# Patient Record
Sex: Female | Born: 1990 | Race: Black or African American | Hispanic: No | Marital: Single | Smoking: Current every day smoker
Health system: Southern US, Community
[De-identification: ages and names within clinical notes are randomized; demographics above are authoritative.]

## PROBLEM LIST (undated history)

## (undated) ENCOUNTER — Inpatient Hospital Stay (HOSPITAL_COMMUNITY): Payer: Self-pay

## (undated) DIAGNOSIS — O139 Gestational [pregnancy-induced] hypertension without significant proteinuria, unspecified trimester: Secondary | ICD-10-CM

## (undated) DIAGNOSIS — A749 Chlamydial infection, unspecified: Secondary | ICD-10-CM

## (undated) DIAGNOSIS — IMO0002 Reserved for concepts with insufficient information to code with codable children: Secondary | ICD-10-CM

## (undated) DIAGNOSIS — N760 Acute vaginitis: Secondary | ICD-10-CM

## (undated) DIAGNOSIS — B379 Candidiasis, unspecified: Secondary | ICD-10-CM

## (undated) DIAGNOSIS — N750 Cyst of Bartholin's gland: Secondary | ICD-10-CM

## (undated) DIAGNOSIS — Z8619 Personal history of other infectious and parasitic diseases: Secondary | ICD-10-CM

## (undated) DIAGNOSIS — A549 Gonococcal infection, unspecified: Secondary | ICD-10-CM

## (undated) DIAGNOSIS — B9689 Other specified bacterial agents as the cause of diseases classified elsewhere: Secondary | ICD-10-CM

## (undated) HISTORY — DX: Candidiasis, unspecified: B37.9

## (undated) HISTORY — DX: Reserved for concepts with insufficient information to code with codable children: IMO0002

## (undated) HISTORY — PX: WISDOM TOOTH EXTRACTION: SHX21

## (undated) HISTORY — DX: Personal history of other infectious and parasitic diseases: Z86.19

---
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
## 2008-10-27 ENCOUNTER — Emergency Department (HOSPITAL_COMMUNITY): Admission: EM | Admit: 2008-10-27 | Discharge: 2008-10-27 | Payer: Self-pay | Admitting: Family Medicine

## 2008-12-06 ENCOUNTER — Emergency Department (HOSPITAL_COMMUNITY): Admission: EM | Admit: 2008-12-06 | Discharge: 2008-12-06 | Payer: Self-pay | Admitting: Emergency Medicine

## 2009-11-09 ENCOUNTER — Emergency Department (HOSPITAL_COMMUNITY): Admission: EM | Admit: 2009-11-09 | Discharge: 2009-11-09 | Payer: Self-pay | Admitting: Family Medicine

## 2009-11-24 ENCOUNTER — Emergency Department (HOSPITAL_COMMUNITY): Admission: EM | Admit: 2009-11-24 | Discharge: 2009-11-24 | Payer: Self-pay | Admitting: Family Medicine

## 2010-05-21 ENCOUNTER — Emergency Department (HOSPITAL_COMMUNITY)
Admission: EM | Admit: 2010-05-21 | Discharge: 2010-05-21 | Payer: Self-pay | Source: Home / Self Care | Admitting: Emergency Medicine

## 2010-07-05 ENCOUNTER — Inpatient Hospital Stay (INDEPENDENT_AMBULATORY_CARE_PROVIDER_SITE_OTHER)
Admission: RE | Admit: 2010-07-05 | Discharge: 2010-07-05 | Disposition: A | Discharge status: Home / Self Care | Payer: BC Managed Care – PPO | Source: Ambulatory Visit | Attending: Family Medicine | Admitting: Family Medicine

## 2010-07-05 DIAGNOSIS — I889 Nonspecific lymphadenitis, unspecified: Secondary | ICD-10-CM

## 2010-08-13 LAB — GC/CHLAMYDIA PROBE AMP, GENITAL: GC Probe Amp, Genital: POSITIVE — AB

## 2010-08-13 LAB — POCT PREGNANCY, URINE: Preg Test, Ur: NEGATIVE

## 2010-08-13 LAB — POCT URINALYSIS DIP (DEVICE)
Hgb urine dipstick: NEGATIVE
Protein, ur: NEGATIVE mg/dL
Specific Gravity, Urine: 1.025 (ref 1.005–1.030)
Urobilinogen, UA: 0.2 mg/dL (ref 0.0–1.0)

## 2010-08-13 LAB — WET PREP, GENITAL: WBC, Wet Prep HPF POC: NONE SEEN

## 2010-09-03 LAB — HCG, SERUM, QUALITATIVE: Preg, Serum: NEGATIVE

## 2011-03-10 ENCOUNTER — Inpatient Hospital Stay (INDEPENDENT_AMBULATORY_CARE_PROVIDER_SITE_OTHER)
Admission: RE | Admit: 2011-03-10 | Discharge: 2011-03-10 | Disposition: A | Discharge status: Home / Self Care | Payer: BC Managed Care – PPO | Source: Ambulatory Visit | Attending: Emergency Medicine | Admitting: Emergency Medicine

## 2011-03-10 DIAGNOSIS — N898 Other specified noninflammatory disorders of vagina: Secondary | ICD-10-CM

## 2011-03-10 LAB — POCT URINALYSIS DIP (DEVICE)
Glucose, UA: NEGATIVE mg/dL
Leukocytes, UA: NEGATIVE
Nitrite: NEGATIVE
Urobilinogen, UA: 1 mg/dL (ref 0.0–1.0)

## 2011-03-10 LAB — WET PREP, GENITAL
Trich, Wet Prep: NONE SEEN
Yeast Wet Prep HPF POC: NONE SEEN

## 2011-03-10 LAB — POCT I-STAT, CHEM 8
HCT: 43 % (ref 36.0–46.0)
Hemoglobin: 14.6 g/dL (ref 12.0–15.0)
Potassium: 4.1 mEq/L (ref 3.5–5.1)
Sodium: 141 mEq/L (ref 135–145)

## 2011-05-09 ENCOUNTER — Emergency Department (INDEPENDENT_AMBULATORY_CARE_PROVIDER_SITE_OTHER)
Admission: EM | Admit: 2011-05-09 | Discharge: 2011-05-09 | Disposition: A | Discharge status: Home / Self Care | Payer: BC Managed Care – PPO | Source: Home / Self Care | Attending: Emergency Medicine | Admitting: Emergency Medicine

## 2011-05-09 DIAGNOSIS — Z3400 Encounter for supervision of normal first pregnancy, unspecified trimester: Secondary | ICD-10-CM

## 2011-05-09 DIAGNOSIS — Z331 Pregnant state, incidental: Secondary | ICD-10-CM

## 2011-05-09 MED ORDER — PRENATAL VITAMINS 0.8 MG PO TABS: 1.0000 | ORAL_TABLET | Freq: Every day | ORAL | Status: DC

## 2011-05-09 NOTE — ED Provider Notes (Signed)
History     CSN: [id number] Arrival date & time: 05/09/2011  7:26 PM   First MD Initiated Contact with Patient 05/09/11 1908      Chief Complaint  Patient presents with  . Possible Pregnancy    (Consider location/radiation/quality/duration/timing/severity/associated sxs/prior treatment) HPI Comments: She comes in today for a pregnancy test. Her last menstrual period was November 11. She has had no morning sickness, but has had some breast swelling and tenderness. She's had mild lower abdominal crampy pain. Has not had any bleeding. She has had unprotected intercourse. She denies any upper abdominal pain, vomiting, or fever. This is her first pregnancy. She's otherwise in good health and is not taking any medications.   History reviewed. No pertinent past medical history.  History reviewed. No pertinent past surgical history.  No family history on file.  History  Substance Use Topics  . Smoking status: Current Everyday Smoker  . Smokeless tobacco: Not on file  . Alcohol Use: No    OB History    Grav Para Term Preterm Abortions TAB SAB Ect Mult Living                  Review of Systems  Constitutional: Negative for fever and chills.  Gastrointestinal: Negative for nausea, vomiting, abdominal pain and diarrhea.  Genitourinary: Positive for menstrual problem. Negative for dysuria, urgency, frequency, hematuria, vaginal bleeding, vaginal discharge, difficulty urinating, genital sores, vaginal pain, pelvic pain and dyspareunia.    Allergies  Review of patient's allergies indicates no known allergies.  Home Medications   Current Outpatient Rx  Name Route Sig Dispense Refill  . PRENATAL VITAMINS 0.8 MG PO TABS Oral Take 1 tablet by mouth daily. 30 tablet 0    BP 126/72  Pulse 93  Temp(Src) 98.4 F (36.9 C) (Oral)  Resp 16  SpO2 100%  LMP 04/08/2011  Physical Exam  Nursing note and vitals reviewed. Constitutional: She is oriented to person, place, and time. She  appears well-developed and well-nourished. No distress.  HENT:  Head: Normocephalic and atraumatic.  Mouth/Throat: Oropharynx is clear and moist.  Eyes: Conjunctivae and EOM are normal. Pupils are equal, round, and reactive to light. No scleral icterus.  Neck: Normal range of motion. Neck supple.  Cardiovascular: Normal rate, regular rhythm and normal heart sounds.  Exam reveals no gallop and no friction rub.   No murmur heard. Pulmonary/Chest: Effort normal and breath sounds normal. No respiratory distress. She has no wheezes. She has no rales.  Abdominal: Soft. Bowel sounds are normal. She exhibits no distension and no mass. There is no tenderness. There is no rebound and no guarding.  Lymphadenopathy:    She has no cervical adenopathy.  Neurological: She is alert and oriented to person, place, and time.  Skin: Skin is warm and dry. No rash noted. She is not diaphoretic.    ED Course  Procedures (including critical care time)   Labs Reviewed  POCT PREGNANCY, URINE  POCT PREGNANCY, URINE   No results found.   1. Pregnancy, first       MDM  She is about [redacted] weeks pregnant. Right now she would it to seek a pregnancy termination. I gave her the name of the [facility] clinic and [address] to followup with or women's hospital clinics if she should decide to keep the baby.        Roque Lias, MD 05/09/11 2158

## 2011-05-09 NOTE — ED Notes (Signed)
Desires pregnancy test, LMP 04-08-11.  States one negative and one positive at home UPT.  Denies sx.

## 2011-05-12 ENCOUNTER — Inpatient Hospital Stay (HOSPITAL_COMMUNITY)
Admission: AD | Admit: 2011-05-12 | Discharge: 2011-05-12 | Disposition: A | Discharge status: Home / Self Care | Payer: BC Managed Care – PPO | Source: Ambulatory Visit | Attending: Obstetrics and Gynecology | Admitting: Obstetrics and Gynecology

## 2011-05-12 ENCOUNTER — Encounter (HOSPITAL_COMMUNITY): Payer: Self-pay | Admitting: *Deleted

## 2011-05-12 ENCOUNTER — Inpatient Hospital Stay (HOSPITAL_COMMUNITY): Payer: BC Managed Care – PPO

## 2011-05-12 DIAGNOSIS — R109 Unspecified abdominal pain: Secondary | ICD-10-CM | POA: Insufficient documentation

## 2011-05-12 DIAGNOSIS — Z1389 Encounter for screening for other disorder: Secondary | ICD-10-CM

## 2011-05-12 DIAGNOSIS — Z3490 Encounter for supervision of normal pregnancy, unspecified, unspecified trimester: Secondary | ICD-10-CM

## 2011-05-12 DIAGNOSIS — O2390 Unspecified genitourinary tract infection in pregnancy, unspecified trimester: Secondary | ICD-10-CM | POA: Insufficient documentation

## 2011-05-12 DIAGNOSIS — N760 Acute vaginitis: Secondary | ICD-10-CM | POA: Insufficient documentation

## 2011-05-12 DIAGNOSIS — A499 Bacterial infection, unspecified: Secondary | ICD-10-CM

## 2011-05-12 DIAGNOSIS — B9689 Other specified bacterial agents as the cause of diseases classified elsewhere: Secondary | ICD-10-CM

## 2011-05-12 LAB — ABO/RH: ABO/RH(D): B POS

## 2011-05-12 LAB — CBC
Hemoglobin: 13.8 g/dL (ref 12.0–15.0)
MCH: 28.3 pg (ref 26.0–34.0)
MCV: 84.4 fL (ref 78.0–100.0)
RBC: 4.87 MIL/uL (ref 3.87–5.11)

## 2011-05-12 LAB — WET PREP, GENITAL
Trich, Wet Prep: NONE SEEN
Yeast Wet Prep HPF POC: NONE SEEN

## 2011-05-12 LAB — OB RESULTS CONSOLE HIV ANTIBODY (ROUTINE TESTING): HIV: NONREACTIVE

## 2011-05-12 MED ORDER — METRONIDAZOLE 0.75 % VA GEL: 1.0000 | Freq: Two times a day (BID) | VAGINAL | Status: AC

## 2011-05-12 MED ORDER — METRONIDAZOLE 500 MG PO TABS: 500.0000 mg | ORAL_TABLET | Freq: Two times a day (BID) | ORAL | Status: DC

## 2011-05-12 NOTE — Progress Notes (Signed)
Pt reports having cramping on and off all month. Went to urgent care and told to come to [facility] if cramping continues. Denies vag discahrge or bleeding.

## 2011-05-12 NOTE — ED Provider Notes (Signed)
History     No chief complaint on file.  HPI 20 y.o. G1P0 at [redacted]w[redacted]d with low abd cramping, no vaginal bleeding or discharge.    Past Medical History  Diagnosis Date  . No pertinent past medical history     Past Surgical History  Procedure Date  . No past surgeries     History reviewed. No pertinent family history.  History  Substance Use Topics  . Smoking status: Current Everyday Smoker -- 0.2 packs/day  . Smokeless tobacco: Not on file  . Alcohol Use: No    Allergies: No Known Allergies  No prescriptions prior to admission    Review of Systems  Constitutional: Negative.   Respiratory: Negative.   Cardiovascular: Negative.   Gastrointestinal: Positive for abdominal pain. Negative for nausea, vomiting, diarrhea and constipation.  Genitourinary: Negative for dysuria, urgency, frequency, hematuria and flank pain.       Negative for vaginal bleeding, vaginal discharge, dyspareunia  Musculoskeletal: Negative.   Neurological: Negative.   Psychiatric/Behavioral: Negative.    Physical Exam   Blood pressure 107/74, pulse 89, temperature 98.5 F (36.9 C), temperature source Oral, resp. rate 18, height 5' 6.5" (1.689 m), weight 186 lb 12.8 oz (84.732 kg), last menstrual period 04/08/2011.  Physical Exam  Constitutional: She is oriented to person, place, and time. She appears well-developed and well-nourished. No distress.  HENT:  Head: Normocephalic and atraumatic.  Cardiovascular: Normal rate, regular rhythm and normal heart sounds.   Respiratory: Effort normal and breath sounds normal. No respiratory distress.  GI: Soft. Bowel sounds are normal. She exhibits no distension and no mass. There is no tenderness. There is no rebound and no guarding.  Genitourinary: There is no rash or lesion on the right labia. There is no rash or lesion on the left labia. Uterus is not deviated, not enlarged, not fixed and not tender. Cervix exhibits no motion tenderness, no discharge and no  friability. Right adnexum displays no mass, no tenderness and no fullness. Left adnexum displays tenderness. Left adnexum displays no mass and no fullness. No erythema, tenderness or bleeding around the vagina. No vaginal discharge found.  Neurological: She is alert and oriented to person, place, and time.  Skin: Skin is warm and dry.  Psychiatric: She has a normal mood and affect.    MAU Course  Procedures  Results for orders placed during the hospital encounter of 05/12/11 (from the past 24 hour(s))  CBC     Status: Normal   Collection Time   05/12/11  1:45 PM      Component Value Range   WBC 7.2  4.0 - 10.5 (K/uL)   RBC 4.87  3.87 - 5.11 (MIL/uL)   Hemoglobin 13.8  12.0 - 15.0 (g/dL)   HCT 11.9  14.7 - 82.9 (%)   MCV 84.4  78.0 - 100.0 (fL)   MCH 28.3  26.0 - 34.0 (pg)   MCHC 33.6  30.0 - 36.0 (g/dL)   RDW 56.2  13.0 - 86.5 (%)   Platelets 321  150 - 400 (K/uL)  ABO/RH     Status: Normal   Collection Time   05/12/11  1:45 PM      Component Value Range   ABO/RH(D) B POS    HCG, QUANTITATIVE, PREGNANCY     Status: Abnormal   Collection Time   05/12/11  1:45 PM      Component Value Range   hCG, Beta Chain, Quant, S 4984 (*) <5 (mIU/mL)  WET PREP, GENITAL  Status: Abnormal   Collection Time   05/12/11  1:55 PM      Component Value Range   Yeast, Wet Prep NONE SEEN  NONE SEEN    Trich, Wet Prep NONE SEEN  NONE SEEN    Clue Cells, Wet Prep MANY (*) NONE SEEN    WBC, Wet Prep HPF POC FEW (*) NONE SEEN     U/S: 5.1 wk IUGS with yolk sac, no fetal pole seen yet Assessment and Plan  20 y.o. G1P0 at [redacted]w[redacted]d BV - rx flagyl Pt unsure if she will continue the pregnancy or not - gave pregnancy verification letter if needed [facility] 05/12/2011, 3:30 PM

## 2011-05-13 NOTE — ED Provider Notes (Signed)
Agree with above note.  Kelly Chan 05/13/2011 7:42 AM

## 2011-05-14 LAB — GC/CHLAMYDIA PROBE AMP, GENITAL
Chlamydia, DNA Probe: NEGATIVE
GC Probe Amp, Genital: NEGATIVE

## 2011-05-19 ENCOUNTER — Encounter (HOSPITAL_COMMUNITY): Payer: Self-pay | Admitting: *Deleted

## 2011-05-19 ENCOUNTER — Inpatient Hospital Stay (HOSPITAL_COMMUNITY): Payer: BC Managed Care – PPO

## 2011-05-19 ENCOUNTER — Inpatient Hospital Stay (HOSPITAL_COMMUNITY)
Admission: AD | Admit: 2011-05-19 | Discharge: 2011-05-19 | Disposition: A | Discharge status: Home / Self Care | Payer: BC Managed Care – PPO | Source: Ambulatory Visit | Attending: Obstetrics and Gynecology | Admitting: Obstetrics and Gynecology

## 2011-05-19 DIAGNOSIS — O200 Threatened abortion: Secondary | ICD-10-CM | POA: Insufficient documentation

## 2011-05-19 HISTORY — DX: Gonococcal infection, unspecified: A54.9

## 2011-05-19 NOTE — Progress Notes (Signed)
Saw pink blood when wiping after voiding.  Was in MAU last week, dx with BV, started gel for treatment yesterday.

## 2011-05-19 NOTE — Progress Notes (Signed)
Pt reprots having some vaginal bleeding when wiped today. . Reports mild cramping. Reports intercourse 2 days ago.

## 2011-05-19 NOTE — ED Provider Notes (Signed)
History     CSN: [id number]  Arrival date & time 05/19/11  1152   None     Chief Complaint  Patient presents with  . Vaginal Bleeding    (Consider location/radiation/quality/duration/timing/severity/associated sxs/prior treatment) HPI Kelly Chan is a 20 y.o. female @ [redacted]w[redacted]d gestation who presents to MAU for vaginal bleeding. She was evaluated here on 05/12/11 and had an IUP at 5 weeks and one day with a YS. Blood type B positive, cultures for GC and Chlamydia were negative.  She also has had lower abdominal cramping. LMP 04/09/11. Last pap smear = never had one. Current sex partner x 2 years. Hx of GC one year ago. No birth control.The history was provided by the patient.  Past Medical History  Diagnosis Date  . Gonorrhea     Past Surgical History  Procedure Date  . No past surgeries     History reviewed. No pertinent family history.  History  Substance Use Topics  . Smoking status: Current Everyday Smoker -- 0.2 packs/day  . Smokeless tobacco: Never Used  . Alcohol Use: No    OB History    Grav Para Term Preterm Abortions TAB SAB Ect Mult Living   1               Review of Systems  Constitutional: Negative for fever, chills, diaphoresis and fatigue.  HENT: Negative for ear pain, congestion, sore throat, facial swelling, neck pain, neck stiffness, dental problem and sinus pressure.   Eyes: Negative for photophobia, pain and discharge.  Respiratory: Negative for cough, chest tightness and wheezing.   Cardiovascular: Negative.   Gastrointestinal: Positive for abdominal pain and constipation. Negative for nausea, vomiting, diarrhea and abdominal distention.  Genitourinary: Positive for vaginal bleeding and pelvic pain. Negative for dysuria, frequency, flank pain and difficulty urinating.  Musculoskeletal: Negative for myalgias, back pain and gait problem.  Skin: Negative for color change and rash.  Neurological: Positive for headaches. Negative for dizziness,  speech difficulty, weakness, light-headedness and numbness.  Psychiatric/Behavioral: Negative for confusion and agitation. The patient is not nervous/anxious.     Allergies  Review of patient's allergies indicates no known allergies.  Home Medications  No current outpatient prescriptions on file.  BP 109/75  Pulse 84  Temp(Src) 98.6 F (37 C) (Oral)  Resp 84  Ht 5' 5.5" (1.664 m)  Wt 187 lb 6.4 oz (85.004 kg)  BMI 30.71 kg/m2  LMP 04/08/2011  Physical Exam  Nursing note and vitals reviewed. Constitutional: She is oriented to person, place, and time. She appears well-developed and well-nourished.  HENT:  Head: Normocephalic.  Eyes: EOM are normal.  Neck: Neck supple.  Cardiovascular: Normal rate.   Pulmonary/Chest: Effort normal.  Abdominal: Soft. There is no tenderness.  Genitourinary:       External genitalia without lesions. Scant blood vaginal vault. Cervix long, closed, no CMT, no adnexal tenderness. Uterus slightly enlarged.  Musculoskeletal: Normal range of motion.  Neurological: She is alert and oriented to person, place, and time. No cranial nerve deficit.  Skin: Skin is warm and dry.  Psychiatric: She has a normal mood and affect. Her behavior is normal. Judgment and thought content normal.    US Ob Transvaginal  05/19/2011  *RADIOLOGY REPORT*  Clinical Data: Spotting.  Assess viability.  TRANSVAGINAL OB ULTRASOUND  Technique:  Transvaginal ultrasound was performed for evaluation of the gestation as well as the maternal uterus and adnexal regions.  Comparison: 05/12/2011.  Findings: Single intrauterine gestation.  By crown-rump length,  estimated gestational age is 6 weeks 2 days.  Fetal heart rate 137 beats per minute.  No subchorionic hemorrhage.  Right ovary unremarkable.  Again noted is a large cyst within the left ovary measuring up to 5.5 cm.  Single internal septation.  IMPRESSION: Single intrauterine gestation, 6-week-2-day.  Fetal heart rate 137 beats per  minute.  Stable large left ovarian cyst.  Original Report Authenticated By: Cyndie Chime, M.D.   Assessment: Viable IUP at 6 weeks and 2 days gestation   threatened AB  Plan: Start prenatal care  Instructions to patient re: threatened AB ED Course  Procedures  MDM          Kerrie Buffalo, NP 05/19/11 1505

## 2011-05-20 NOTE — ED Provider Notes (Signed)
Agree with above note.  Kelly Chan 05/20/2011 7:52 AM   

## 2011-05-27 ENCOUNTER — Inpatient Hospital Stay (HOSPITAL_COMMUNITY)
Admission: AD | Admit: 2011-05-27 | Discharge: 2011-05-27 | Disposition: A | Discharge status: Home / Self Care | Payer: BC Managed Care – PPO | Source: Ambulatory Visit | Attending: Obstetrics & Gynecology | Admitting: Obstetrics & Gynecology

## 2011-05-27 ENCOUNTER — Encounter (HOSPITAL_COMMUNITY): Payer: Self-pay | Admitting: Obstetrics and Gynecology

## 2011-05-27 DIAGNOSIS — O26899 Other specified pregnancy related conditions, unspecified trimester: Secondary | ICD-10-CM

## 2011-05-27 DIAGNOSIS — O219 Vomiting of pregnancy, unspecified: Secondary | ICD-10-CM

## 2011-05-27 DIAGNOSIS — O210 Mild hyperemesis gravidarum: Secondary | ICD-10-CM | POA: Insufficient documentation

## 2011-05-27 DIAGNOSIS — O99891 Other specified diseases and conditions complicating pregnancy: Secondary | ICD-10-CM | POA: Insufficient documentation

## 2011-05-27 DIAGNOSIS — R109 Unspecified abdominal pain: Secondary | ICD-10-CM | POA: Insufficient documentation

## 2011-05-27 LAB — URINALYSIS, ROUTINE W REFLEX MICROSCOPIC
Bilirubin Urine: NEGATIVE
Glucose, UA: NEGATIVE mg/dL
Hgb urine dipstick: NEGATIVE
Ketones, ur: NEGATIVE mg/dL
Specific Gravity, Urine: 1.02 (ref 1.005–1.030)
pH: 6 (ref 5.0–8.0)

## 2011-05-27 MED ORDER — ONDANSETRON HCL 8 MG PO TABS: 8.0000 mg | ORAL_TABLET | Freq: Three times a day (TID) | ORAL | Status: AC | PRN

## 2011-05-27 MED ORDER — ONDANSETRON 8 MG PO TBDP
8.0000 mg | ORAL_TABLET | Freq: Once | ORAL | Status: AC
  Administered 2011-05-27: 8 mg via ORAL
  Filled 2011-05-27: qty 1

## 2011-05-27 NOTE — Progress Notes (Signed)
"  My lower RT side has been hurting for the last 2 days.  The pain is always constant and then there will be a sharp intense pain that comes and goes.  I've just started getting nauseated, so I'm throwing up everything I eat. No longer spotting after finishing the medication for my bacterial infection."

## 2011-05-27 NOTE — Progress Notes (Signed)
Pt states, " I've had pain in my lower right side of my abdomen for 2 days; sometimes the pain goes lower."

## 2011-05-27 NOTE — Progress Notes (Signed)
Pt reports decrease in nausea>desires RX for Zofran

## 2011-05-27 NOTE — ED Provider Notes (Signed)
History     Chief Complaint  Patient presents with  . Abdominal Pain   HPI My lower RT side has been hurting for the last 2 days. The pain is always constant and then there will be a sharp intense pain that comes and goes. I've just started getting nauseated, so I'm throwing up everything I eat. No longer spotting after finishing the medication for my bacterial infection."  Ultrasound on 12/22 showed a single intrauterine gestation, 6-week-2-day. Fetal heart rate 137 beats per minute;  In addition there was a stable large left ovarian cyst.     Past Medical History  Diagnosis Date  . Gonorrhea   . No pertinent past medical history     Past Surgical History  Procedure Date  . No past surgeries     Family History  Problem Relation Age of Onset  . Cancer Maternal Aunt   . Cancer Maternal Aunt     History  Substance Use Topics  . Smoking status: Former Smoker -- 0.2 packs/day    Quit date: 05/25/2011  . Smokeless tobacco: Never Used   Comment: "I've quit because I can't tolerate the smell anymore."  . Alcohol Use: No    Allergies: No Known Allergies  Prescriptions prior to admission  Medication Sig Dispense Refill  . Prenatal Vit-Fe Fumarate-FA (PRENATAL MULTIVITAMIN) TABS Take 1 tablet by mouth daily.          Review of Systems  Gastrointestinal: Positive for nausea, vomiting and abdominal pain.  Genitourinary: Negative.        No vaginal bleeding   Physical Exam   Blood pressure 116/76, pulse 93, temperature 99.1 F (37.3 C), temperature source Oral, resp. rate 18, height 5' 5.5" (1.664 m), weight 81.874 kg (180 lb 8 oz), last menstrual period 04/08/2011, SpO2 98.00%.  Physical Exam  Constitutional: She is oriented to person, place, and time. She appears well-developed and well-nourished. No distress.       Laughing with guest  HENT:  Head: Normocephalic.  Mouth/Throat: Mucous membranes are normal. Mucous membranes are not dry.  Neck: Normal range of motion.  Neck supple.  Cardiovascular: Normal rate, regular rhythm and normal heart sounds.   Respiratory: Effort normal and breath sounds normal.  GI: Soft. She exhibits no mass. There is no tenderness. There is no rebound and no guarding.       +fetal heart tones with bedside ultrasound  Genitourinary: No bleeding around the vagina.  Neurological: She is alert and oriented to person, place, and time.  Skin: Skin is warm and dry.    MAU Course  Procedures  Results for orders placed during the hospital encounter of 05/27/11 (from the past 24 hour(s))  URINALYSIS, ROUTINE W REFLEX MICROSCOPIC     Status: Normal   Collection Time   05/27/11  2:42 AM      Component Value Range   Color, Urine YELLOW  YELLOW    APPearance CLEAR  CLEAR    Specific Gravity, Urine 1.020  1.005 - 1.030    pH 6.0  5.0 - 8.0    Glucose, UA NEGATIVE  NEGATIVE (mg/dL)   Hgb urine dipstick NEGATIVE  NEGATIVE    Bilirubin Urine NEGATIVE  NEGATIVE    Ketones, ur NEGATIVE  NEGATIVE (mg/dL)   Protein, ur NEGATIVE  NEGATIVE (mg/dL)   Urobilinogen, UA 0.2  0.0 - 1.0 (mg/dL)   Nitrite NEGATIVE  NEGATIVE    Leukocytes, UA NEGATIVE  NEGATIVE      Assessment and Plan  Vomiting in Pregnancy Abdominal Pain in Early Pregnancy  Plan: DC to home RX Zofran Pregnancy Precautions  [facility] 05/27/2011, 2:26 AM

## 2011-06-29 DIAGNOSIS — IMO0002 Reserved for concepts with insufficient information to code with codable children: Secondary | ICD-10-CM

## 2011-06-29 DIAGNOSIS — R87619 Unspecified abnormal cytological findings in specimens from cervix uteri: Secondary | ICD-10-CM

## 2011-06-29 HISTORY — DX: Reserved for concepts with insufficient information to code with codable children: IMO0002

## 2011-06-29 HISTORY — DX: Unspecified abnormal cytological findings in specimens from cervix uteri: R87.619

## 2011-07-26 ENCOUNTER — Inpatient Hospital Stay (HOSPITAL_COMMUNITY)
Admission: AD | Admit: 2011-07-26 | Discharge: 2011-07-26 | Disposition: A | Discharge status: Home Health | Payer: BC Managed Care – PPO | Source: Ambulatory Visit | Attending: Obstetrics & Gynecology | Admitting: Obstetrics & Gynecology

## 2011-07-26 ENCOUNTER — Encounter (HOSPITAL_COMMUNITY): Payer: Self-pay | Admitting: *Deleted

## 2011-07-26 DIAGNOSIS — O99891 Other specified diseases and conditions complicating pregnancy: Secondary | ICD-10-CM | POA: Insufficient documentation

## 2011-07-26 DIAGNOSIS — R32 Unspecified urinary incontinence: Secondary | ICD-10-CM | POA: Insufficient documentation

## 2011-07-26 DIAGNOSIS — R350 Frequency of micturition: Secondary | ICD-10-CM | POA: Insufficient documentation

## 2011-07-26 HISTORY — DX: Acute vaginitis: N76.0

## 2011-07-26 HISTORY — DX: Acute vaginitis: B96.89

## 2011-07-26 LAB — URINALYSIS, ROUTINE W REFLEX MICROSCOPIC
Bilirubin Urine: NEGATIVE
Hgb urine dipstick: NEGATIVE
Specific Gravity, Urine: 1.015 (ref 1.005–1.030)
pH: 7.5 (ref 5.0–8.0)

## 2011-07-26 LAB — WET PREP, GENITAL
Clue Cells Wet Prep HPF POC: NONE SEEN
Trich, Wet Prep: NONE SEEN
Yeast Wet Prep HPF POC: NONE SEEN

## 2011-07-26 NOTE — ED Provider Notes (Signed)
History     Chief Complaint  Patient presents with  . Urinary Frequency   HPI Kelly Chan is 21 y.o. G1P0 [redacted]w[redacted]d weeks presenting with urinary frequency, voiding small amounts of urine and  incontinence. Began with this pregnancy-last 3 weeks.  Denies vaginal bleeding or vaginal discharge.  Denies fever or chills.  Pt of Dr. Huel Coventry but was told they will not accept Medicaid for this pregnancy.  Plans to look for another doctor.      Past Medical History  Diagnosis Date  . Gonorrhea   . No pertinent past medical history   . BV (bacterial vaginosis)     Past Surgical History  Procedure Date  . Wisdom tooth extraction     Family History  Problem Relation Age of Onset  . Cancer Maternal Aunt   . Cancer Maternal Aunt     History  Substance Use Topics  . Smoking status: Former Smoker -- 0.2 packs/day    Quit date: 05/25/2011  . Smokeless tobacco: Never Used   Comment: "I've quit because I can't tolerate the smell anymore."  . Alcohol Use: No    Allergies: No Known Allergies  Prescriptions prior to admission  Medication Sig Dispense Refill  . Multiple Vitamins-Minerals (MULTIVITAMIN GUMMIES ADULT PO) Take 2 tablets by mouth every morning.      . promethazine (PHENERGAN) 25 MG tablet Take 25 mg by mouth at bedtime as needed. For nausea      . Pseudoephed-APAP-Guaifenesin (TYLENOL SINUS SEVERE CONGEST PO) Take 2 tablets by mouth daily as needed. For headache        Review of Systems  Genitourinary: Positive for frequency.       + for incontinence with laughing, sneezing Neg for vaginal bleeding and vaginal discharge   Physical Exam   Blood pressure 111/72, pulse 117, temperature 97.4 F (36.3 C), temperature source Oral, resp. rate 16, height 5\' 5"  (1.651 m), weight 85.095 kg (187 lb 9.6 oz), last menstrual period 04/08/2011, SpO2 100.00%.  Physical Exam  Constitutional: She is oriented to person, place, and time. She appears well-developed and  well-nourished.  HENT:  Head: Normocephalic.  Neck: Normal range of motion.  Respiratory: Effort normal.  GI: Soft.       Gravid  Genitourinary: Uterus is enlarged. Cervix exhibits no discharge and no friability. No bleeding around the vagina. Vaginal discharge (small amount of white discharge without odor) found.       Bladder and uterus appear to be well supported.  Neurological: She is alert and oriented to person, place, and time.  Skin: Skin is warm and dry.   Results for orders placed during the hospital encounter of 07/26/11 (from the past 24 hour(s))  URINALYSIS, ROUTINE W REFLEX MICROSCOPIC     Status: Abnormal   Collection Time   07/26/11  1:25 PM      Component Value Range   Color, Urine YELLOW  YELLOW    APPearance CLOUDY (*) CLEAR    Specific Gravity, Urine 1.015  1.005 - 1.030    pH 7.5  5.0 - 8.0    Glucose, UA NEGATIVE  NEGATIVE (mg/dL)   Hgb urine dipstick NEGATIVE  NEGATIVE    Bilirubin Urine NEGATIVE  NEGATIVE    Ketones, ur NEGATIVE  NEGATIVE (mg/dL)   Protein, ur NEGATIVE  NEGATIVE (mg/dL)   Urobilinogen, UA 0.2  0.0 - 1.0 (mg/dL)   Nitrite NEGATIVE  NEGATIVE    Leukocytes, UA NEGATIVE  NEGATIVE   WET PREP, GENITAL  Status: Abnormal   Collection Time   07/26/11  2:35 PM      Component Value Range   Yeast Wet Prep HPF POC NONE SEEN  NONE SEEN    Trich, Wet Prep NONE SEEN  NONE SEEN    Clue Cells Wet Prep HPF POC NONE SEEN  NONE SEEN    WBC, Wet Prep HPF POC MANY (*) NONE SEEN    MAU Course  Procedures   GC/CHl recently done at Dr. Huel Coventry office--not repeated today.  MDM  Reviewed lab results with the patient and reassured there were no signs of infection.  Instructed her to avoid over filling of bladder to prevent leaking urine and practice Kegals.     Assessment and Plan  A:  Urinary frequency and incontinence with negative UA.   P: Stressed importance of continued prenatal care.   Aveon Colquhoun,EVE M 07/26/2011, 2:25 PM   Matt Holmes, NP 07/26/11  1523

## 2011-07-26 NOTE — Progress Notes (Signed)
Urinary incontinence when laughing, coughing, sneezing, or vomiting, no other time.  Denies pain, burning or pressure when voiding.

## 2011-07-26 NOTE — Discharge Instructions (Signed)
Urinary Incontinence Your doctor wants you to have this information about urinary incontinence. This is the inability to keep urine in your body until you decide to release it. CAUSES  Prostate gland enlargement is a common cause of urinary incontinence. But there are many different causes for losing urinary control. They include:  Medicines.   Infections.   Prostate problems.   Surgery.   Neurological diseases.   Emotional factors.  DIAGNOSIS  Evaluating the cause of incontinence is important in choosing the best treatment. This may require:  An ultrasound exam.   Kidney and bladder X-rays.   Cystoscopy. This is an exam of the bladder using a narrow scope.  TREATMENT  For incontinent patients, normal daily hygiene and using changing pads or adult diapers regularly will prevent offensive odors and skin damage from the moisture. Changing your medicines may help control incontinence. Your caregiver may prescribe some medicines to help you regain control. Avoid caffeine. It can over-stimulate the bladder. Use the bathroom regularly. Try about every 2 to 3 hours even if you do not feel the need. Take time to empty your bladder completely. After urinating, wait a minute. Then try to urinate again. External devices used to catch urine or an indwelling urine catheter (Foley catheter) may be needed as well. Some prostate gland problems require surgery to correct. Call your caregiver for more information. Document Released: 06/21/2004 Document Revised: 01/24/2011 Document Reviewed: 06/16/2008 Exit[employer] Patient Information 2012 [employer]. 

## 2011-07-26 NOTE — Progress Notes (Signed)
Patient states she has been having urinary frequency and incontinence of urine especially with coughing, laughing and sneezing. Occasional vomiting.

## 2011-07-29 NOTE — ED Provider Notes (Signed)
Agree with above note.  Kelly Chan H. 07/29/2011 4:13 PM

## 2011-08-17 ENCOUNTER — Encounter (INDEPENDENT_AMBULATORY_CARE_PROVIDER_SITE_OTHER): Payer: BC Managed Care – PPO

## 2011-08-17 DIAGNOSIS — Z331 Pregnant state, incidental: Secondary | ICD-10-CM

## 2011-08-17 DIAGNOSIS — Z8619 Personal history of other infectious and parasitic diseases: Secondary | ICD-10-CM | POA: Insufficient documentation

## 2011-08-17 DIAGNOSIS — N83202 Unspecified ovarian cyst, left side: Secondary | ICD-10-CM | POA: Insufficient documentation

## 2011-08-20 ENCOUNTER — Encounter: Payer: BC Managed Care – PPO | Admitting: Registered Nurse

## 2011-08-31 ENCOUNTER — Encounter (INDEPENDENT_AMBULATORY_CARE_PROVIDER_SITE_OTHER): Payer: BC Managed Care – PPO | Admitting: Registered Nurse

## 2011-08-31 DIAGNOSIS — Z331 Pregnant state, incidental: Secondary | ICD-10-CM

## 2011-09-21 ENCOUNTER — Inpatient Hospital Stay (HOSPITAL_COMMUNITY)
Admission: AD | Admit: 2011-09-21 | Discharge: 2011-09-21 | Disposition: A | Discharge status: Home / Self Care | Payer: BC Managed Care – PPO | Source: Ambulatory Visit | Attending: Obstetrics and Gynecology | Admitting: Obstetrics and Gynecology

## 2011-09-21 ENCOUNTER — Telehealth: Payer: Self-pay | Admitting: Obstetrics and Gynecology

## 2011-09-21 ENCOUNTER — Encounter (HOSPITAL_COMMUNITY): Payer: Self-pay

## 2011-09-21 DIAGNOSIS — O368190 Decreased fetal movements, unspecified trimester, not applicable or unspecified: Secondary | ICD-10-CM

## 2011-09-21 DIAGNOSIS — O99340 Other mental disorders complicating pregnancy, unspecified trimester: Secondary | ICD-10-CM | POA: Insufficient documentation

## 2011-09-21 DIAGNOSIS — F411 Generalized anxiety disorder: Secondary | ICD-10-CM | POA: Insufficient documentation

## 2011-09-21 NOTE — MAU Note (Signed)
Patient states she has been having lower abdominal pain off and on for about 4 weeks, worse when walking and at night when turning. States she has been feeling fetal movement but has not felt any movement since 4-25. Denies any bleeding or leaking.

## 2011-09-21 NOTE — Discharge Instructions (Signed)
Fetal Movement Counts Patient Name: __________________________________________________ Patient Due Date: ____________________ Kick counts is highly recommended in high risk pregnancies, but it is a good idea for every pregnant woman to do. Start counting fetal movements at 28 weeks of the pregnancy. Fetal movements increase after eating a full meal or eating or drinking something sweet (the blood sugar is higher). It is also important to drink plenty of fluids (well hydrated) before doing the count. Lie on your left side because it helps with the circulation or you can sit in a comfortable chair with your arms over your belly (abdomen) with no distractions around you. DOING THE COUNT  Try to do the count the same time of day each time you do it.   Mark the day and time, then see how long it takes for you to feel 10 movements (kicks, flutters, swishes, rolls). You should have at least 10 movements within 2 hours. You will most likely feel 10 movements in much less than 2 hours. If you do not, wait an hour and count again. After a couple of days you will see a pattern.   What you are looking for is a change in the pattern or not enough counts in 2 hours. Is it taking longer in time to reach 10 movements?  SEEK MEDICAL CARE IF:  You feel less than 10 counts in 2 hours. Tried twice.   No movement in one hour.   The pattern is changing or taking longer each day to reach 10 counts in 2 hours.   You feel the baby is not moving as it usually does.  Date: ____________ Movements: ____________ Start time: ____________ Finish time: ____________  Date: ____________ Movements: ____________ Start time: ____________ Finish time: ____________ Date: ____________ Movements: ____________ Start time: ____________ Finish time: ____________ Date: ____________ Movements: ____________ Start time: ____________ Finish time: ____________ Date: ____________ Movements: ____________ Start time: ____________ Finish time:  ____________ Date: ____________ Movements: ____________ Start time: ____________ Finish time: ____________ Date: ____________ Movements: ____________ Start time: ____________ Finish time: ____________ Date: ____________ Movements: ____________ Start time: ____________ Finish time: ____________  Date: ____________ Movements: ____________ Start time: ____________ Finish time: ____________ Date: ____________ Movements: ____________ Start time: ____________ Finish time: ____________ Date: ____________ Movements: ____________ Start time: ____________ Finish time: ____________ Date: ____________ Movements: ____________ Start time: ____________ Finish time: ____________ Date: ____________ Movements: ____________ Start time: ____________ Finish time: ____________ Date: ____________ Movements: ____________ Start time: ____________ Finish time: ____________ Date: ____________ Movements: ____________ Start time: ____________ Finish time: ____________  Date: ____________ Movements: ____________ Start time: ____________ Finish time: ____________ Date: ____________ Movements: ____________ Start time: ____________ Finish time: ____________ Date: ____________ Movements: ____________ Start time: ____________ Finish time: ____________ Date: ____________ Movements: ____________ Start time: ____________ Finish time: ____________ Date: ____________ Movements: ____________ Start time: ____________ Finish time: ____________ Date: ____________ Movements: ____________ Start time: ____________ Finish time: ____________ Date: ____________ Movements: ____________ Start time: ____________ Finish time: ____________  Date: ____________ Movements: ____________ Start time: ____________ Finish time: ____________ Date: ____________ Movements: ____________ Start time: ____________ Finish time: ____________ Date: ____________ Movements: ____________ Start time: ____________ Finish time: ____________ Date: ____________ Movements:  ____________ Start time: ____________ Finish time: ____________ Date: ____________ Movements: ____________ Start time: ____________ Finish time: ____________ Date: ____________ Movements: ____________ Start time: ____________ Finish time: ____________ Date: ____________ Movements: ____________ Start time: ____________ Finish time: ____________  Date: ____________ Movements: ____________ Start time: ____________ Finish time: ____________ Date: ____________ Movements: ____________ Start time: ____________ Finish time: ____________ Date: ____________ Movements: ____________ Start time:   ____________ Finish time: ____________ Date: ____________ Movements: ____________ Start time: ____________ Finish time: ____________ Date: ____________ Movements: ____________ Start time: ____________ Finish time: ____________ Date: ____________ Movements: ____________ Start time: ____________ Finish time: ____________ Date: ____________ Movements: ____________ Start time: ____________ Finish time: ____________  Date: ____________ Movements: ____________ Start time: ____________ Finish time: ____________ Date: ____________ Movements: ____________ Start time: ____________ Finish time: ____________ Date: ____________ Movements: ____________ Start time: ____________ Finish time: ____________ Date: ____________ Movements: ____________ Start time: ____________ Finish time: ____________ Date: ____________ Movements: ____________ Start time: ____________ Finish time: ____________ Date: ____________ Movements: ____________ Start time: ____________ Finish time: ____________ Date: ____________ Movements: ____________ Start time: ____________ Finish time: ____________  Date: ____________ Movements: ____________ Start time: ____________ Finish time: ____________ Date: ____________ Movements: ____________ Start time: ____________ Finish time: ____________ Date: ____________ Movements: ____________ Start time: ____________ Finish  time: ____________ Date: ____________ Movements: ____________ Start time: ____________ Finish time: ____________ Date: ____________ Movements: ____________ Start time: ____________ Finish time: ____________ Date: ____________ Movements: ____________ Start time: ____________ Finish time: ____________ Date: ____________ Movements: ____________ Start time: ____________ Finish time: ____________  Date: ____________ Movements: ____________ Start time: ____________ Finish time: ____________ Date: ____________ Movements: ____________ Start time: ____________ Finish time: ____________ Date: ____________ Movements: ____________ Start time: ____________ Finish time: ____________ Date: ____________ Movements: ____________ Start time: ____________ Finish time: ____________ Date: ____________ Movements: ____________ Start time: ____________ Finish time: ____________ Date: ____________ Movements: ____________ Start time: ____________ Finish time: ____________ Document Released: 06/13/2006 Document Revised: 05/03/2011 Document Reviewed: 12/14/2008 [employer] Patient Information 2012 [employer]. 

## 2011-09-21 NOTE — Telephone Encounter (Signed)
PT CALLED, IS 23 WKS, STATES HAS NOT FELT HER BABY MOVE VERY MUCH FOR THE PAST TWO DAYS, DENIES ANY BLDG/LOF, PT SAYS SHE DID EAT AND DRINK AND STILL NOT MUCH MOVEMENT, PER CHS ADVISED PT TO EAT A SNACK AND DRINK SOMETHING COLD AND SWEET, LIE ON LEFT SIDE FOR 30 MINUTES, DECREASE ALL DISTRACTIONS AND MONITOR BABY'S MOVEMENTS, IF STILL NOT MUCH MOVEMENT , PT TO CALL ON CALL MIDWIFE TO LET THEM KNOW, PT VOICES  UNDERSTANDING.

## 2011-09-21 NOTE — Telephone Encounter (Signed)
Routed to keshia °

## 2011-09-21 NOTE — MAU Provider Note (Signed)
History   Kelly Chan is a 21y.o. Black female who presents at 23 weeks w/ CC of decreased fetal movement x 2 days.  She called office just before 1600 w/ complaint and tried to eat a snack and do fetal kick counts for 30 minutes, but still unable to feel movement per her report, so she called back at 1630 and instructed to come in for eval.  Since pt has arrived in MAU, she does report feeling fetal movements.  Pt denies recent illness, fever; no UTI s/s or GI complaints.  Reports "allergies" bothering her, but not taking any medications and trying to avoid outdoors.  Reports her father has severe allergies and has to get "allergy shots."  Pt states she is nervous about a "spot on her baby's heart on u/s."  Pt is unemployed and not in school.  Pt initiated care at [facility] and then transferred care to [facility]  Pt denies any recent smoking (cigarettes and MJ).  Pt reports she did have aneuploidy screening and it was normal. Pregnancy r/f: 1.  LV EIF on anatomy 2.  H/o gc/ct 3.  Stopped smoking with pregnancy (previous tobacco and MJ) 4.  H/o Lt ov cyst  CSN: [id number]  Arrival date and time: 09/21/11 1643   None     Chief Complaint  Patient presents with  . Decreased Fetal Movement  . Abdominal Pain   HPI  OB History    Grav Para Term Preterm Abortions TAB SAB Ect Mult Living   1         0      Past Medical History  Diagnosis Date  . Gonorrhea   . No pertinent past medical history   . BV (bacterial vaginosis)     Past Surgical History  Procedure Date  . Wisdom tooth extraction     Family History  Problem Relation Age of Onset  . Cancer Maternal Aunt   . Cancer Maternal Aunt     History  Substance Use Topics  . Smoking status: Former Smoker -- 0.2 packs/day    Quit date: 05/25/2011  . Smokeless tobacco: Never Used   Comment: "I've quit because I can't tolerate the smell anymore."  . Alcohol Use: No    Allergies: No Known Allergies  Prescriptions  prior to admission  Medication Sig Dispense Refill  . Multiple Vitamins-Minerals (MULTIVITAMIN GUMMIES ADULT PO) Take 2 tablets by mouth every morning.        ROS--see history above Physical Exam   Blood pressure 120/81, pulse 97, temperature 97.9 F (36.6 C), temperature source Oral, resp. rate 16, height 5\' 6"  (1.676 m), weight 87.635 kg (193 lb 3.2 oz), last menstrual period 04/08/2011, SpO2 100.00%.  Physical Exam  Constitutional: She is oriented to person, place, and time. She appears well-developed and well-nourished. No distress.  Eyes:       Bilateral eyes red laterally  Cardiovascular: Normal rate.   Respiratory: Effort normal.  GI: Soft.       gravid  Genitourinary:       Pelvic deferred  Neurological: She is alert and oriented to person, place, and time. She has normal reflexes.  Skin: Skin is warm and dry.   EFM:  140, reactive, moderate variability, no decels TOCO:  No discernable ctxs MAU Course  Procedures 1. NST  Assessment and Plan  1.  IUP at 23 weeks 2.  H/o decreased fetal movement 3.  Reactive NST 4.  Anxiety r/t u/s finding, but further disc'd  and pt more at ease 5.  Sedentary lifestyle 6.  Recent allergy exacerbation  1.  D/c'd home after NST w/ fetal Kick count instructions  2.  F/u 09/28/11 at office or prn 3.  Disc'd comfort measures for allergies 4.  enc'd more active lifestyle   Aileen Amore H 09/21/2011, 5:57 PM

## 2011-09-28 ENCOUNTER — Encounter: Payer: Self-pay | Admitting: Obstetrics and Gynecology

## 2011-09-28 ENCOUNTER — Ambulatory Visit (INDEPENDENT_AMBULATORY_CARE_PROVIDER_SITE_OTHER): Payer: BC Managed Care – PPO

## 2011-09-28 ENCOUNTER — Ambulatory Visit (INDEPENDENT_AMBULATORY_CARE_PROVIDER_SITE_OTHER): Payer: BC Managed Care – PPO | Admitting: Obstetrics and Gynecology

## 2011-09-28 VITALS — BP 110/70 | Wt 196.0 lb

## 2011-09-28 DIAGNOSIS — O358XX0 Maternal care for other (suspected) fetal abnormality and damage, not applicable or unspecified: Secondary | ICD-10-CM

## 2011-09-28 DIAGNOSIS — O368390 Maternal care for abnormalities of the fetal heart rate or rhythm, unspecified trimester, not applicable or unspecified: Secondary | ICD-10-CM

## 2011-09-28 LAB — US OB FOLLOW UP

## 2011-10-02 DIAGNOSIS — O358XX0 Maternal care for other (suspected) fetal abnormality and damage, not applicable or unspecified: Secondary | ICD-10-CM | POA: Insufficient documentation

## 2011-10-02 NOTE — Progress Notes (Signed)
Pt without complaint USF/U EIF EFW:  781grams   1-12#,   89.3% [id number] Position vtx Placenta location: posterior, grade 0 EIF still present.  F/u pp

## 2011-10-22 ENCOUNTER — Encounter (HOSPITAL_COMMUNITY): Payer: Self-pay | Admitting: Emergency Medicine

## 2011-10-22 ENCOUNTER — Emergency Department (INDEPENDENT_AMBULATORY_CARE_PROVIDER_SITE_OTHER)
Admission: EM | Admit: 2011-10-22 | Discharge: 2011-10-22 | Disposition: A | Discharge status: Home / Self Care | Payer: BC Managed Care – PPO | Source: Home / Self Care | Attending: Emergency Medicine | Admitting: Emergency Medicine

## 2011-10-22 DIAGNOSIS — L0291 Cutaneous abscess, unspecified: Secondary | ICD-10-CM

## 2011-10-22 DIAGNOSIS — L0390 Cellulitis, unspecified: Secondary | ICD-10-CM

## 2011-10-22 MED ORDER — CLINDAMYCIN HCL 300 MG PO CAPS: 300.0000 mg | ORAL_CAPSULE | Freq: Four times a day (QID) | ORAL | Status: AC

## 2011-10-22 NOTE — ED Notes (Signed)
PT HERE WITH LEFT OUTER LABIA SMALL TENDER BOIL THAT FORMED X 3 WEEKS AGO.C/O PAIN WITH RUBBING BUT NO DRAINAGE OR FEVER.PT HAS APPT NEXT Friday WITH PCP BUT STATES THE PAIN IS GETTING WORSE.PT IS 7 MNTHS PREGNANT

## 2011-10-22 NOTE — ED Provider Notes (Signed)
Chief Complaint  Patient presents with  . Recurrent Skin Infections    History of Present Illness:   The patient is a 21 year old female who has had a three-week history of a bump in her left groin area. This started out as a tiny papule has gotten larger in size. It's somewhat painful to touch. It has not drained any pus. She denies any fever or chills. She denies skin rashes elsewhere. She had a similar bump about 8 months ago that went away on its own. She denies any history of MRSA or diabetes. She is 7 months pregnant. I saw her back in December when the diagnosis of pregnancy was first night. There've been no complications of her pregnancy.  Review of Systems:  Other than noted above, the patient denies any of the following symptoms: Systemic:  No fever, chills, sweats, weight loss, or fatigue. ENT:  No nasal congestion, rhinorrhea, sore throat, swelling of lips, tongue or throat. Resp:  No cough, wheezing, or shortness of breath. Skin:  No rash, itching, nodules, or suspicious lesions.  PMFSH:  Past medical history, family history, social history, meds, and allergies were reviewed.  Physical Exam:   Vital signs:  BP 120/92  Pulse 108  Temp(Src) 98.5 F (36.9 C) (Oral)  Resp 18  SpO2 100%  LMP 04/08/2011 Gen:  Alert, oriented, in no distress. ENT:  Pharynx clear, no intraoral lesions, moist mucous membranes. Lungs:  Clear to auscultation. Skin:  There is a 1 cm, firm, tender papule in the left groin. There is no fluctuance, no drainage, no other skin lesions.  Assessment:  The encounter diagnosis was Abscess. This does not appear to need incision and drainage today. Since she is pregnant will treat with clindamycin. Also advised moist warm compresses.  Plan:   1.  The following meds were prescribed:   New Prescriptions   CLINDAMYCIN (CLEOCIN) 300 MG CAPSULE    Take 1 capsule (300 mg total) by mouth 4 (four) times daily.   2.  The patient was instructed in symptomatic care  and handouts were given. 3.  The patient was told to return if becoming worse in any way, if no better in 3 or 4 days, and given some red flag symptoms that would indicate earlier return.     Reuben Likes, MD 10/22/11 1016

## 2011-10-22 NOTE — Discharge Instructions (Signed)
Abscess An abscess (boil or furuncle) is an infected area that contains a collection of pus.  SYMPTOMS Signs and symptoms of an abscess include pain, tenderness, redness, or hardness. You may feel a moveable soft area under your skin. An abscess can occur anywhere in the body.  TREATMENT  A surgical cut (incision) may be made over your abscess to drain the pus. Gauze may be packed into the space or a drain may be looped through the abscess cavity (pocket). This provides a drain that will allow the cavity to heal from the inside outwards. The abscess may be painful for a few days, but should feel much better if it was drained.  Your abscess, if seen early, may not have localized and may not have been drained. If not, another appointment may be required if it does not get better on its own or with medications. HOME CARE INSTRUCTIONS   Only take over-the-counter or prescription medicines for pain, discomfort, or fever as directed by your caregiver.   Take your antibiotics as directed if they were prescribed. Finish them even if you start to feel better.   Keep the skin and clothes clean around your abscess.   If the abscess was drained, you will need to use gauze dressing to collect any draining pus. Dressings will typically need to be changed 3 or more times a day.   The infection may spread by skin contact with others. Avoid skin contact as much as possible.   Practice good hygiene. This includes regular hand washing, cover any draining skin lesions, and do not share personal care items.   If you participate in sports, do not share athletic equipment, towels, whirlpools, or personal care items. Shower after every practice or tournament.   If a draining area cannot be adequately covered:   Do not participate in sports.   Children should not participate in day care until the wound has healed or drainage stops.   If your caregiver has given you a follow-up appointment, it is very important  to keep that appointment. Not keeping the appointment could result in a much worse infection, chronic or permanent injury, pain, and disability. If there is any problem keeping the appointment, you must call back to this facility for assistance.  SEEK MEDICAL CARE IF:   You develop increased pain, swelling, redness, drainage, or bleeding in the wound site.   You develop signs of generalized infection including muscle aches, chills, fever, or a general ill feeling.   You have an oral temperature above 102 F (38.9 C).  MAKE SURE YOU:   Understand these instructions.   Will watch your condition.   Will get help right away if you are not doing well or get worse.  Document Released: 02/21/2005 Document Revised: 05/03/2011 Document Reviewed: 12/16/2007 [facility] Patient Information 2012 [place], [place].   To restore the normal balance of "good bacteria" in your system.  Take a probiotic once daily.  These can be gotten over the counter at the drug store without a prescription and come under various brand names such as Culturelle, Align, Florastore, and [employer].  The best thing to do is to ask your pharmacist to recommend a good probiotic that is not too expensive.

## 2011-10-26 ENCOUNTER — Ambulatory Visit (INDEPENDENT_AMBULATORY_CARE_PROVIDER_SITE_OTHER): Payer: BC Managed Care – PPO | Admitting: Obstetrics and Gynecology

## 2011-10-26 ENCOUNTER — Encounter: Payer: Self-pay | Admitting: Obstetrics and Gynecology

## 2011-10-26 ENCOUNTER — Other Ambulatory Visit: Payer: BC Managed Care – PPO

## 2011-10-26 VITALS — BP 112/80 | Wt 198.0 lb

## 2011-10-26 DIAGNOSIS — Z331 Pregnant state, incidental: Secondary | ICD-10-CM

## 2011-10-26 LAB — HEMOGLOBIN: Hemoglobin: 13 g/dL (ref 12.0–15.0)

## 2011-10-26 NOTE — Progress Notes (Signed)
Pt states no concerns today. Glucola given today.

## 2011-10-26 NOTE — Progress Notes (Signed)
A/P Glucola, hemoglobin and RPR today Fetal kick counts reviewed All patients questions answered Return in two weeks Continue Prenatal vitamins Blood type rh positive

## 2011-10-26 NOTE — Patient Instructions (Signed)
Fetal Movement Counts Patient Name: __________________________________________________ Patient Due Date: ____________________ Kick counts is highly recommended in high risk pregnancies, but it is a good idea for every pregnant woman to do. Start counting fetal movements at 28 weeks of the pregnancy. Fetal movements increase after eating a full meal or eating or drinking something sweet (the blood sugar is higher). It is also important to drink plenty of fluids (well hydrated) before doing the count. Lie on your left side because it helps with the circulation or you can sit in a comfortable chair with your arms over your belly (abdomen) with no distractions around you. DOING THE COUNT  Try to do the count the same time of day each time you do it.   Mark the day and time, then see how long it takes for you to feel 10 movements (kicks, flutters, swishes, rolls). You should have at least 10 movements within 2 hours. You will most likely feel 10 movements in much less than 2 hours. If you do not, wait an hour and count again. After a couple of days you will see a pattern.   What you are looking for is a change in the pattern or not enough counts in 2 hours. Is it taking longer in time to reach 10 movements?  SEEK MEDICAL CARE IF:  You feel less than 10 counts in 2 hours. Tried twice.   No movement in one hour.   The pattern is changing or taking longer each day to reach 10 counts in 2 hours.   You feel the baby is not moving as it usually does.  Date: ____________ Movements: ____________ Start time: ____________ Finish time: ____________  Date: ____________ Movements: ____________ Start time: ____________ Finish time: ____________ Date: ____________ Movements: ____________ Start time: ____________ Finish time: ____________ Date: ____________ Movements: ____________ Start time: ____________ Finish time: ____________ Date: ____________ Movements: ____________ Start time: ____________ Finish time:  ____________ Date: ____________ Movements: ____________ Start time: ____________ Finish time: ____________ Date: ____________ Movements: ____________ Start time: ____________ Finish time: ____________ Date: ____________ Movements: ____________ Start time: ____________ Finish time: ____________  Date: ____________ Movements: ____________ Start time: ____________ Finish time: ____________ Date: ____________ Movements: ____________ Start time: ____________ Finish time: ____________ Date: ____________ Movements: ____________ Start time: ____________ Finish time: ____________ Date: ____________ Movements: ____________ Start time: ____________ Finish time: ____________ Date: ____________ Movements: ____________ Start time: ____________ Finish time: ____________ Date: ____________ Movements: ____________ Start time: ____________ Finish time: ____________ Date: ____________ Movements: ____________ Start time: ____________ Finish time: ____________  Date: ____________ Movements: ____________ Start time: ____________ Finish time: ____________ Date: ____________ Movements: ____________ Start time: ____________ Finish time: ____________ Date: ____________ Movements: ____________ Start time: ____________ Finish time: ____________ Date: ____________ Movements: ____________ Start time: ____________ Finish time: ____________ Date: ____________ Movements: ____________ Start time: ____________ Finish time: ____________ Date: ____________ Movements: ____________ Start time: ____________ Finish time: ____________ Date: ____________ Movements: ____________ Start time: ____________ Finish time: ____________  Date: ____________ Movements: ____________ Start time: ____________ Finish time: ____________ Date: ____________ Movements: ____________ Start time: ____________ Finish time: ____________ Date: ____________ Movements: ____________ Start time: ____________ Finish time: ____________ Date: ____________ Movements:  ____________ Start time: ____________ Finish time: ____________ Date: ____________ Movements: ____________ Start time: ____________ Finish time: ____________ Date: ____________ Movements: ____________ Start time: ____________ Finish time: ____________ Date: ____________ Movements: ____________ Start time: ____________ Finish time: ____________  Date: ____________ Movements: ____________ Start time: ____________ Finish time: ____________ Date: ____________ Movements: ____________ Start time: ____________ Finish time: ____________ Date: ____________ Movements: ____________ Start time:   ____________ Finish time: ____________ Date: ____________ Movements: ____________ Start time: ____________ Finish time: ____________ Date: ____________ Movements: ____________ Start time: ____________ Finish time: ____________ Date: ____________ Movements: ____________ Start time: ____________ Finish time: ____________ Date: ____________ Movements: ____________ Start time: ____________ Finish time: ____________  Date: ____________ Movements: ____________ Start time: ____________ Finish time: ____________ Date: ____________ Movements: ____________ Start time: ____________ Finish time: ____________ Date: ____________ Movements: ____________ Start time: ____________ Finish time: ____________ Date: ____________ Movements: ____________ Start time: ____________ Finish time: ____________ Date: ____________ Movements: ____________ Start time: ____________ Finish time: ____________ Date: ____________ Movements: ____________ Start time: ____________ Finish time: ____________ Date: ____________ Movements: ____________ Start time: ____________ Finish time: ____________  Date: ____________ Movements: ____________ Start time: ____________ Finish time: ____________ Date: ____________ Movements: ____________ Start time: ____________ Finish time: ____________ Date: ____________ Movements: ____________ Start time: ____________ Finish  time: ____________ Date: ____________ Movements: ____________ Start time: ____________ Finish time: ____________ Date: ____________ Movements: ____________ Start time: ____________ Finish time: ____________ Date: ____________ Movements: ____________ Start time: ____________ Finish time: ____________ Date: ____________ Movements: ____________ Start time: ____________ Finish time: ____________  Date: ____________ Movements: ____________ Start time: ____________ Finish time: ____________ Date: ____________ Movements: ____________ Start time: ____________ Finish time: ____________ Date: ____________ Movements: ____________ Start time: ____________ Finish time: ____________ Date: ____________ Movements: ____________ Start time: ____________ Finish time: ____________ Date: ____________ Movements: ____________ Start time: ____________ Finish time: ____________ Date: ____________ Movements: ____________ Start time: ____________ Finish time: ____________ Document Released: 06/13/2006 Document Revised: 05/03/2011 Document Reviewed: 12/14/2008 [employer] Patient Information 2012 [employer]. 

## 2011-10-27 LAB — RPR

## 2011-11-08 DIAGNOSIS — N83202 Unspecified ovarian cyst, left side: Secondary | ICD-10-CM

## 2011-11-08 DIAGNOSIS — Z8619 Personal history of other infectious and parasitic diseases: Secondary | ICD-10-CM

## 2011-11-09 ENCOUNTER — Ambulatory Visit (INDEPENDENT_AMBULATORY_CARE_PROVIDER_SITE_OTHER): Payer: BC Managed Care – PPO | Admitting: Obstetrics and Gynecology

## 2011-11-09 ENCOUNTER — Encounter: Payer: Self-pay | Admitting: Obstetrics and Gynecology

## 2011-11-09 VITALS — BP 118/70 | Wt 200.0 lb

## 2011-11-09 DIAGNOSIS — Z331 Pregnant state, incidental: Secondary | ICD-10-CM

## 2011-11-09 DIAGNOSIS — O26899 Other specified pregnancy related conditions, unspecified trimester: Secondary | ICD-10-CM

## 2011-11-09 DIAGNOSIS — N898 Other specified noninflammatory disorders of vagina: Secondary | ICD-10-CM

## 2011-11-09 LAB — POCT WET PREP (WET MOUNT): pH: 4

## 2011-11-09 NOTE — Patient Instructions (Signed)
Preventing Preterm Labor Preterm labor is when a pregnant woman has contractions that cause the cervix to open, shorten, and thin before 37 weeks of pregnancy. You will have regular contractions (tightening) 2 to 3 minutes apart. This usually causes discomfort or pain. HOME CARE  Eat a healthy diet.   Take your vitamins as told by your doctor.   Drink enough fluids to keep your pee (urine) clear or pale yellow every day.   Get rest and sleep.   Do not have sex if you are at high risk for preterm labor.   Follow your doctor's advice about activity, medicines, and tests.   Avoid stress.   Avoid hard labor or exercise that lasts for a long time.   Do not smoke.  GET HELP RIGHT AWAY IF:   You are having contractions.   You have belly (abdominal) pain.   You have bleeding from your vagina.   You have pain when you pee (urinate).   You have abnormal discharge from your vagina.   You have a temperature by mouth above 102 F (38.9 C).  MAKE SURE YOU:  Understand these instructions.   Will watch your condition.   Will get help if you are not doing well or get worse.  Document Released: 08/10/2008 Document Revised: 05/03/2011 Document Reviewed: 08/10/2008 Exit[employer] Patient Information 2012 [employer]. 

## 2011-11-09 NOTE — Progress Notes (Signed)
C/o increased d'c due to recent amox use for a boil  Wants to know if baby is still breeched

## 2011-11-09 NOTE — Progress Notes (Signed)
[redacted]w[redacted]d rec CBE, took BF classes  C/o discharge that feels like could be YI, freq gets YI after ABX, rec daily probiotic, denies itching or burning Use mild soap, loose clothing, air dry  Wet prep neg - cx closed, vault WNL, no erythema, sm amt milky d/c noted  EG - WNL  FKC and PTL sx's rv'd rv'd 1hr gtt=79, hgb=13 and RPR NR

## 2011-11-21 ENCOUNTER — Ambulatory Visit (INDEPENDENT_AMBULATORY_CARE_PROVIDER_SITE_OTHER): Payer: BC Managed Care – PPO | Admitting: Obstetrics and Gynecology

## 2011-11-21 ENCOUNTER — Encounter: Payer: Self-pay | Admitting: Obstetrics and Gynecology

## 2011-11-21 VITALS — BP 119/70 | Wt 203.0 lb

## 2011-11-21 DIAGNOSIS — Z331 Pregnant state, incidental: Secondary | ICD-10-CM

## 2011-11-21 NOTE — Progress Notes (Signed)
Pt would like to know how much baby weighs at this time. No complaints.

## 2011-11-21 NOTE — Progress Notes (Signed)
Doing well.  Hemoglobin 13.  Glucola 79.  RPR nonreactive. Return to office in 2 weeks. Dr. Stefano Gaul

## 2011-12-05 ENCOUNTER — Ambulatory Visit (INDEPENDENT_AMBULATORY_CARE_PROVIDER_SITE_OTHER): Payer: BC Managed Care – PPO | Admitting: Obstetrics and Gynecology

## 2011-12-05 ENCOUNTER — Encounter: Payer: Self-pay | Admitting: Obstetrics and Gynecology

## 2011-12-05 VITALS — BP 120/78 | Wt 207.0 lb

## 2011-12-05 DIAGNOSIS — F1290 Cannabis use, unspecified, uncomplicated: Secondary | ICD-10-CM

## 2011-12-05 DIAGNOSIS — F1210 Cannabis abuse, uncomplicated: Secondary | ICD-10-CM

## 2011-12-05 DIAGNOSIS — Z3400 Encounter for supervision of normal first pregnancy, unspecified trimester: Secondary | ICD-10-CM

## 2011-12-05 DIAGNOSIS — Z87891 Personal history of nicotine dependence: Secondary | ICD-10-CM

## 2011-12-05 NOTE — Progress Notes (Signed)
[redacted]w[redacted]d Signed up for CBE and took BF Plans circ  rv'd PTL and FKC and pelvic titls  GBS and GC/CT NV

## 2011-12-05 NOTE — Progress Notes (Signed)
Pt states both arms, fingers, and legs hurt making it difficult to flex  Hemoglobin 13 1 gtt 79 RPR NR

## 2011-12-05 NOTE — Patient Instructions (Signed)
Preventing Preterm Labor Preterm labor is when a pregnant woman has contractions that cause the cervix to open, shorten, and thin before 37 weeks of pregnancy. You will have regular contractions (tightening) 2 to 3 minutes apart. This usually causes discomfort or pain. HOME CARE  Eat a healthy diet.   Take your vitamins as told by your doctor.   Drink enough fluids to keep your pee (urine) clear or pale yellow every day.   Get rest and sleep.   Do not have sex if you are at high risk for preterm labor.   Follow your doctor's advice about activity, medicines, and tests.   Avoid stress.   Avoid hard labor or exercise that lasts for a long time.   Do not smoke.  GET HELP RIGHT AWAY IF:   You are having contractions.   You have belly (abdominal) pain.   You have bleeding from your vagina.   You have pain when you pee (urinate).   You have abnormal discharge from your vagina.   You have a temperature by mouth above 102 F (38.9 C).  MAKE SURE YOU:  Understand these instructions.   Will watch your condition.   Will get help if you are not doing well or get worse.  Document Released: 08/10/2008 Document Revised: 05/03/2011 Document Reviewed: 08/10/2008 Exit[employer] Patient Information 2012 [employer].Fetal Movement Counts Patient Name: __________________________________________________ Patient Due Date: ____________________ Kick counts is highly recommended in high risk pregnancies, but it is a good idea for every pregnant woman to do. Start counting fetal movements at 28 weeks of the pregnancy. Fetal movements increase after eating a full meal or eating or drinking something sweet (the blood sugar is higher). It is also important to drink plenty of fluids (well hydrated) before doing the count. Lie on your left side because it helps with the circulation or you can sit in a comfortable chair with your arms over your belly (abdomen) with no distractions around you. DOING THE  COUNT  Try to do the count the same time of day each time you do it.   Mark the day and time, then see how long it takes for you to feel 10 movements (kicks, flutters, swishes, rolls). You should have at least 10 movements within 2 hours. You will most likely feel 10 movements in much less than 2 hours. If you do not, wait an hour and count again. After a couple of days you will see a pattern.   What you are looking for is a change in the pattern or not enough counts in 2 hours. Is it taking longer in time to reach 10 movements?  SEEK MEDICAL CARE IF:  You feel less than 10 counts in 2 hours. Tried twice.   No movement in one hour.   The pattern is changing or taking longer each day to reach 10 counts in 2 hours.   You feel the baby is not moving as it usually does.  Date: ____________ Movements: ____________ Start time: ____________ Finish time: ____________  Date: ____________ Movements: ____________ Start time: ____________ Finish time: ____________ Date: ____________ Movements: ____________ Start time: ____________ Finish time: ____________ Date: ____________ Movements: ____________ Start time: ____________ Finish time: ____________ Date: ____________ Movements: ____________ Start time: ____________ Finish time: ____________ Date: ____________ Movements: ____________ Start time: ____________ Finish time: ____________ Date: ____________ Movements: ____________ Start time: ____________ Finish time: ____________ Date: ____________ Movements: ____________ Start time: ____________ Finish time: ____________  Date: ____________ Movements: ____________ Start time: ____________ Finish time:   ____________ Date: ____________ Movements: ____________ Start time: ____________ Finish time: ____________ Date: ____________ Movements: ____________ Start time: ____________ Finish time: ____________ Date: ____________ Movements: ____________ Start time: ____________ Finish time: ____________ Date:  ____________ Movements: ____________ Start time: ____________ Finish time: ____________ Date: ____________ Movements: ____________ Start time: ____________ Finish time: ____________ Date: ____________ Movements: ____________ Start time: ____________ Finish time: ____________  Date: ____________ Movements: ____________ Start time: ____________ Finish time: ____________ Date: ____________ Movements: ____________ Start time: ____________ Finish time: ____________ Date: ____________ Movements: ____________ Start time: ____________ Finish time: ____________ Date: ____________ Movements: ____________ Start time: ____________ Finish time: ____________ Date: ____________ Movements: ____________ Start time: ____________ Finish time: ____________ Date: ____________ Movements: ____________ Start time: ____________ Finish time: ____________ Date: ____________ Movements: ____________ Start time: ____________ Finish time: ____________  Date: ____________ Movements: ____________ Start time: ____________ Finish time: ____________ Date: ____________ Movements: ____________ Start time: ____________ Finish time: ____________ Date: ____________ Movements: ____________ Start time: ____________ Finish time: ____________ Date: ____________ Movements: ____________ Start time: ____________ Finish time: ____________ Date: ____________ Movements: ____________ Start time: ____________ Finish time: ____________ Date: ____________ Movements: ____________ Start time: ____________ Finish time: ____________ Date: ____________ Movements: ____________ Start time: ____________ Finish time: ____________  Date: ____________ Movements: ____________ Start time: ____________ Finish time: ____________ Date: ____________ Movements: ____________ Start time: ____________ Finish time: ____________ Date: ____________ Movements: ____________ Start time: ____________ Finish time: ____________ Date: ____________ Movements: ____________ Start  time: ____________ Finish time: ____________ Date: ____________ Movements: ____________ Start time: ____________ Finish time: ____________ Date: ____________ Movements: ____________ Start time: ____________ Finish time: ____________ Date: ____________ Movements: ____________ Start time: ____________ Finish time: ____________  Date: ____________ Movements: ____________ Start time: ____________ Finish time: ____________ Date: ____________ Movements: ____________ Start time: ____________ Finish time: ____________ Date: ____________ Movements: ____________ Start time: ____________ Finish time: ____________ Date: ____________ Movements: ____________ Start time: ____________ Finish time: ____________ Date: ____________ Movements: ____________ Start time: ____________ Finish time: ____________ Date: ____________ Movements: ____________ Start time: ____________ Finish time: ____________ Date: ____________ Movements: ____________ Start time: ____________ Finish time: ____________  Date: ____________ Movements: ____________ Start time: ____________ Finish time: ____________ Date: ____________ Movements: ____________ Start time: ____________ Finish time: ____________ Date: ____________ Movements: ____________ Start time: ____________ Finish time: ____________ Date: ____________ Movements: ____________ Start time: ____________ Finish time: ____________ Date: ____________ Movements: ____________ Start time: ____________ Finish time: ____________ Date: ____________ Movements: ____________ Start time: ____________ Finish time: ____________ Date: ____________ Movements: ____________ Start time: ____________ Finish time: ____________  Date: ____________ Movements: ____________ Start time: ____________ Finish time: ____________ Date: ____________ Movements: ____________ Start time: ____________ Finish time: ____________ Date: ____________ Movements: ____________ Start time: ____________ Finish time:  ____________ Date: ____________ Movements: ____________ Start time: ____________ Finish time: ____________ Date: ____________ Movements: ____________ Start time: ____________ Finish time: ____________ Date: ____________ Movements: ____________ Start time: ____________ Finish time: ____________ Document Released: 06/13/2006 Document Revised: 05/03/2011 Document Reviewed: 12/14/2008 [employer] Patient Information 2012 [employer]. 

## 2011-12-06 DIAGNOSIS — Z3400 Encounter for supervision of normal first pregnancy, unspecified trimester: Secondary | ICD-10-CM | POA: Insufficient documentation

## 2011-12-06 DIAGNOSIS — Z87891 Personal history of nicotine dependence: Secondary | ICD-10-CM | POA: Insufficient documentation

## 2011-12-06 DIAGNOSIS — F1290 Cannabis use, unspecified, uncomplicated: Secondary | ICD-10-CM | POA: Insufficient documentation

## 2011-12-19 ENCOUNTER — Ambulatory Visit (INDEPENDENT_AMBULATORY_CARE_PROVIDER_SITE_OTHER): Payer: BC Managed Care – PPO | Admitting: Obstetrics and Gynecology

## 2011-12-19 ENCOUNTER — Encounter: Payer: Self-pay | Admitting: Obstetrics and Gynecology

## 2011-12-19 VITALS — BP 110/78 | Wt 209.0 lb

## 2011-12-19 DIAGNOSIS — Z331 Pregnant state, incidental: Secondary | ICD-10-CM

## 2011-12-19 DIAGNOSIS — Z3490 Encounter for supervision of normal pregnancy, unspecified, unspecified trimester: Secondary | ICD-10-CM

## 2011-12-19 DIAGNOSIS — N898 Other specified noninflammatory disorders of vagina: Secondary | ICD-10-CM

## 2011-12-19 DIAGNOSIS — N899 Noninflammatory disorder of vagina, unspecified: Secondary | ICD-10-CM

## 2011-12-19 MED ORDER — FLUCONAZOLE 100 MG PO TABS
100.0000 mg | ORAL_TABLET | Freq: Every day | ORAL | Status: DC
Start: 2011-12-19 — End: 2012-01-14

## 2011-12-19 NOTE — Addendum Note (Signed)
Addended by: Janeece Agee on: 12/19/2011 09:39 AM   Modules accepted: Orders

## 2011-12-19 NOTE — Progress Notes (Signed)
[redacted]w[redacted]d FM+  C/o thinks she might have a yeast infection. Wet Prep: Ph 4.0 - 4.5 Minimal Yeast seen. Tx Diflucan 100mg s po x 1 as pr patient request. No PV bleeding, No contractions  GC/ Chlamydia & GBS done today. ROB x 1 week.

## 2011-12-19 NOTE — Progress Notes (Signed)
C/O: vaginal irritation x days.

## 2011-12-20 LAB — GC/CHLAMYDIA PROBE AMP, GENITAL
Chlamydia, DNA Probe: NEGATIVE
GC Probe Amp, Genital: NEGATIVE

## 2011-12-27 ENCOUNTER — Encounter: Payer: Self-pay | Admitting: Obstetrics and Gynecology

## 2011-12-27 ENCOUNTER — Ambulatory Visit (INDEPENDENT_AMBULATORY_CARE_PROVIDER_SITE_OTHER): Payer: BC Managed Care – PPO | Admitting: Obstetrics and Gynecology

## 2011-12-27 VITALS — BP 120/80 | Wt 214.0 lb

## 2011-12-27 DIAGNOSIS — Z3490 Encounter for supervision of normal pregnancy, unspecified, unspecified trimester: Secondary | ICD-10-CM

## 2011-12-27 DIAGNOSIS — Z331 Pregnant state, incidental: Secondary | ICD-10-CM

## 2011-12-27 NOTE — Progress Notes (Signed)
[redacted]w[redacted]d GBS: neg Irregular BH CTX at  Intervals. SVE: ROB x 1 week

## 2011-12-27 NOTE — Progress Notes (Signed)
Would like to cx check. ? Abscess on tooth with some discomfort.

## 2012-01-01 ENCOUNTER — Ambulatory Visit (INDEPENDENT_AMBULATORY_CARE_PROVIDER_SITE_OTHER): Payer: BC Managed Care – PPO | Admitting: Obstetrics and Gynecology

## 2012-01-01 ENCOUNTER — Telehealth: Payer: Self-pay | Admitting: Obstetrics and Gynecology

## 2012-01-01 ENCOUNTER — Encounter: Payer: Self-pay | Admitting: Obstetrics and Gynecology

## 2012-01-01 VITALS — BP 120/86 | Wt 218.0 lb

## 2012-01-01 DIAGNOSIS — R109 Unspecified abdominal pain: Secondary | ICD-10-CM

## 2012-01-01 DIAGNOSIS — N949 Unspecified condition associated with female genital organs and menstrual cycle: Secondary | ICD-10-CM

## 2012-01-01 LAB — POCT URINALYSIS DIPSTICK
Blood, UA: NEGATIVE
Ketones, UA: NEGATIVE
Protein, UA: NEGATIVE
Spec Grav, UA: 1.01
Urobilinogen, UA: NEGATIVE
pH, UA: 7

## 2012-01-01 MED ORDER — CYCLOBENZAPRINE HCL 10 MG PO TABS: 10.0000 mg | ORAL_TABLET | Freq: Three times a day (TID) | ORAL | Status: DC | PRN

## 2012-01-01 NOTE — Telephone Encounter (Signed)
Spoke with pt rgd concerns pt states having L side pain not contractions took tylenol no relief positive fetal movement and drinking plenty of water consult with mary cnm pt need eval in  Office per DEE cnm pt have appt appt 01/01/12 at 2:45 pt voice understanding

## 2012-01-01 NOTE — Progress Notes (Signed)
C/o l side abdominal pain since last night

## 2012-01-01 NOTE — Progress Notes (Signed)
[redacted]w[redacted]d C/o left sided pain. C/w Round ligament pain.  Afebrile. No dysuria No change in vaginal secretions. Tx'ed Flexeril 10mg s po every 8 hrs PRN. ROB x 1week.

## 2012-01-04 ENCOUNTER — Encounter: Payer: BC Managed Care – PPO | Admitting: Obstetrics and Gynecology

## 2012-01-09 ENCOUNTER — Ambulatory Visit (INDEPENDENT_AMBULATORY_CARE_PROVIDER_SITE_OTHER): Payer: BC Managed Care – PPO | Admitting: Obstetrics and Gynecology

## 2012-01-09 ENCOUNTER — Encounter: Payer: Self-pay | Admitting: Obstetrics and Gynecology

## 2012-01-09 VITALS — BP 110/78 | Wt 217.0 lb

## 2012-01-09 DIAGNOSIS — Z3490 Encounter for supervision of normal pregnancy, unspecified, unspecified trimester: Secondary | ICD-10-CM

## 2012-01-09 DIAGNOSIS — Z331 Pregnant state, incidental: Secondary | ICD-10-CM

## 2012-01-09 NOTE — Progress Notes (Signed)
Pt miserable & wants membranes swept today

## 2012-01-09 NOTE — Progress Notes (Signed)
[redacted]w[redacted]d No complaints. No change vaginal secretions. SVE: posterior, closed, -3  Vx  ROB x 1 week

## 2012-01-11 ENCOUNTER — Telehealth: Payer: Self-pay | Admitting: Obstetrics and Gynecology

## 2012-01-11 NOTE — Telephone Encounter (Signed)
Tc to pt per telephone call. Pt noticed 1 episode of spotting when wiping @ 4:00am today. Pt had contractions 10 min apart last pm;however has subsided. Positive fetal movement. No vagina leakage. Pt with internal exam on 01/09/12 to possibly strip membranes, but unable to have done per provider. Consulted with mk, pt to monitor spotting and cb if reoccurs. Perform fetal kick counts.  Pt voices understanding.

## 2012-01-11 NOTE — Telephone Encounter (Signed)
TRIAGE/OB/CRAMP,

## 2012-01-13 DIAGNOSIS — O479 False labor, unspecified: Secondary | ICD-10-CM

## 2012-01-14 ENCOUNTER — Encounter (HOSPITAL_COMMUNITY): Payer: Self-pay | Admitting: *Deleted

## 2012-01-14 ENCOUNTER — Telehealth: Payer: Self-pay | Admitting: Obstetrics and Gynecology

## 2012-01-14 ENCOUNTER — Inpatient Hospital Stay (HOSPITAL_COMMUNITY)
Admission: AD | Admit: 2012-01-14 | Discharge: 2012-01-14 | Disposition: A | Discharge status: Home / Self Care | Payer: BC Managed Care – PPO | Source: Ambulatory Visit | Attending: Obstetrics and Gynecology | Admitting: Obstetrics and Gynecology

## 2012-01-14 DIAGNOSIS — Z3400 Encounter for supervision of normal first pregnancy, unspecified trimester: Secondary | ICD-10-CM

## 2012-01-14 DIAGNOSIS — Z87891 Personal history of nicotine dependence: Secondary | ICD-10-CM

## 2012-01-14 DIAGNOSIS — F1290 Cannabis use, unspecified, uncomplicated: Secondary | ICD-10-CM

## 2012-01-14 DIAGNOSIS — O479 False labor, unspecified: Secondary | ICD-10-CM | POA: Insufficient documentation

## 2012-01-14 NOTE — MAU Provider Note (Signed)
  History   Patient gives Hx of CTX since 3 am. CTX at intervlas of 1:10 min and 1:35mins irregular. Prodromal Labor. Evaluation of labor.  CSN: [id number]  Arrival date and time: 01/14/12 1105   None     Chief Complaint  Patient presents with  . Labor Eval   HPI  OB History    Grav Para Term Preterm Abortions TAB SAB Ect Mult Living   1         0      Past Medical History  Diagnosis Date  . Gonorrhea   . BV (bacterial vaginosis)   . Abnormal Pap smear 06/2011  . H/O candidiasis   . History of bacterial infection   . Yeast infection     Past Surgical History  Procedure Date  . Wisdom tooth extraction     Family History  Problem Relation Age of Onset  . Cancer Maternal Aunt     Breast   . Cancer Maternal Aunt     Breast  . Hypertension Maternal Grandmother   . Other Neg Hx     History  Substance Use Topics  . Smoking status: Former Smoker -- 0.2 packs/day    Quit date: 05/25/2011  . Smokeless tobacco: Never Used   Comment: "I've quit because I can't tolerate the smell anymore."  . Alcohol Use: No    Allergies: No Known Allergies  Prescriptions prior to admission  Medication Sig Dispense Refill  . calcium carbonate (TUMS - DOSED IN MG ELEMENTAL CALCIUM) 500 MG chewable tablet Chew 2 tablets by mouth 3 (three) times daily as needed. For heartburn      . Multiple Vitamin (MULTIVITAMIN WITH MINERALS) TABS Take 1 tablet by mouth daily.      . Multiple Vitamins-Minerals (MULTIVITAMIN & MINERAL PO) Take by mouth.        ROS Physical Exam   Blood pressure 127/91, pulse 99, temperature 98 F (36.7 C), temperature source Oral, resp. rate 16, height 5\' 5"  (1.651 m), weight 219 lb 12.8 oz (99.701 kg), last menstrual period 04/08/2011. .. Filed Vitals:   01/14/12 1115 01/14/12 1129 01/14/12 1151 01/14/12 1201  BP:  124/92 127/91 125/92  Pulse:  99 99 90  Temp:  98 F (36.7 C)    TempSrc:  Oral    Resp:  16 16 16   Height: 5\' 5"  (1.651 m)     Weight: 219  lb 12.8 oz (99.701 kg)      NST: Baseline 150 bpm, Cat1          Uc's irregular  Physical Exam  Constitutional: She is oriented to person, place, and time. She appears well-developed.  HENT:  Head: Normocephalic.  Eyes: Pupils are equal, round, and reactive to light.  Neck: Normal range of motion.  Cardiovascular: Normal rate, regular rhythm and normal heart sounds.   Respiratory: Effort normal.  GI: Soft. Bowel sounds are normal.  Genitourinary: Vagina normal.       Cx 0.5/90%/-2 soft and central  Musculoskeletal: She exhibits edema.       Lower extremities +1  Neurological: She is alert and oriented to person, place, and time.  Skin: Skin is warm.  Psychiatric: She has a normal mood and affect.    MAU Course  Procedures  SVE & NST  Assessment and Plan  Prodromal labor @ [redacted]w[redacted]d F/u in 2 days at office as scheduled  Earl Gala, CNM 01/14/2012, 12:00 PM

## 2012-01-14 NOTE — Telephone Encounter (Signed)
Tc to pt per telephone call. Pt c/o contractions q 7/10 min x 3 hours. Pt denies vaginal bldg or watery leakage. Pt c/o dec fm. Pt however has not counted fetal movements. Consulted with DD, pt to increase water intake, monitor fetal kick counts(6 per hour), and cb if contractions increase q 5 min apart. Pt voices understanding.

## 2012-01-14 NOTE — MAU Note (Signed)
Patient states she is having contractions every 10 minutes. Denies any bleeding or leaking and reports good fetal movement.

## 2012-01-16 ENCOUNTER — Encounter (HOSPITAL_COMMUNITY): Payer: Self-pay | Admitting: *Deleted

## 2012-01-16 ENCOUNTER — Inpatient Hospital Stay (HOSPITAL_COMMUNITY)
Admission: AD | Admit: 2012-01-16 | Discharge: 2012-01-19 | DRG: 370 | Disposition: A | Discharge status: Home / Self Care | Payer: BC Managed Care – PPO | Source: Ambulatory Visit | Attending: Obstetrics and Gynecology | Admitting: Obstetrics and Gynecology

## 2012-01-16 ENCOUNTER — Encounter: Payer: BC Managed Care – PPO | Admitting: Obstetrics and Gynecology

## 2012-01-16 DIAGNOSIS — F1290 Cannabis use, unspecified, uncomplicated: Secondary | ICD-10-CM

## 2012-01-16 DIAGNOSIS — O139 Gestational [pregnancy-induced] hypertension without significant proteinuria, unspecified trimester: Secondary | ICD-10-CM | POA: Diagnosis present

## 2012-01-16 DIAGNOSIS — D649 Anemia, unspecified: Secondary | ICD-10-CM | POA: Diagnosis not present

## 2012-01-16 DIAGNOSIS — Z87891 Personal history of nicotine dependence: Secondary | ICD-10-CM

## 2012-01-16 DIAGNOSIS — O9903 Anemia complicating the puerperium: Secondary | ICD-10-CM | POA: Diagnosis not present

## 2012-01-16 DIAGNOSIS — Z3400 Encounter for supervision of normal first pregnancy, unspecified trimester: Secondary | ICD-10-CM

## 2012-01-16 DIAGNOSIS — Z98891 History of uterine scar from previous surgery: Secondary | ICD-10-CM | POA: Diagnosis not present

## 2012-01-16 LAB — URINE MICROSCOPIC-ADD ON

## 2012-01-16 LAB — URINALYSIS, ROUTINE W REFLEX MICROSCOPIC
Nitrite: NEGATIVE
Specific Gravity, Urine: 1.025 (ref 1.005–1.030)
Urobilinogen, UA: 0.2 mg/dL (ref 0.0–1.0)
pH: 6.5 (ref 5.0–8.0)

## 2012-01-16 LAB — CBC WITH DIFFERENTIAL/PLATELET
Basophils Relative: 0 % (ref 0–1)
Eosinophils Absolute: 0.1 10*3/uL (ref 0.0–0.7)
Eosinophils Relative: 1 % (ref 0–5)
Hemoglobin: 12.3 g/dL (ref 12.0–15.0)
MCH: 26.8 pg (ref 26.0–34.0)
MCHC: 33.2 g/dL (ref 30.0–36.0)
MCV: 80.6 fL (ref 78.0–100.0)
Monocytes Absolute: 0.8 10*3/uL (ref 0.1–1.0)
Monocytes Relative: 10 % (ref 3–12)
Neutrophils Relative %: 69 % (ref 43–77)

## 2012-01-16 LAB — COMPREHENSIVE METABOLIC PANEL
BUN: 10 mg/dL (ref 6–23)
Calcium: 9.7 mg/dL (ref 8.4–10.5)
Creatinine, Ser: 0.58 mg/dL (ref 0.50–1.10)
GFR calc Af Amer: >90 mL/min (ref >90)
GFR calc non Af Amer: >90 mL/min (ref >90)
Glucose, Bld: 94 mg/dL (ref 70–99)
Sodium: 135 mEq/L (ref 135–145)
Total Protein: 6.1 g/dL (ref 6.0–8.3)

## 2012-01-16 MED ORDER — CITRIC ACID-SODIUM CITRATE 334-500 MG/5ML PO SOLN
30.0000 mL | ORAL | Status: DC | PRN
  Administered 2012-01-17: 30 mL via ORAL
  Filled 2012-01-16: qty 15

## 2012-01-16 MED ORDER — DIPHENHYDRAMINE HCL 50 MG/ML IJ SOLN: 12.5000 mg | INTRAMUSCULAR | Status: DC | PRN

## 2012-01-16 MED ORDER — OXYTOCIN BOLUS FROM INFUSION
250.0000 mL | Freq: Once | INTRAVENOUS | Status: DC
  Filled 2012-01-16: qty 500

## 2012-01-16 MED ORDER — OXYTOCIN 40 UNITS IN LACTATED RINGERS INFUSION - SIMPLE MED: 62.5000 mL/h | Freq: Once | INTRAVENOUS | Status: DC

## 2012-01-16 MED ORDER — PHENYLEPHRINE 40 MCG/ML (10ML) SYRINGE FOR IV PUSH (FOR BLOOD PRESSURE SUPPORT): 80.0000 ug | PREFILLED_SYRINGE | INTRAVENOUS | Status: DC | PRN

## 2012-01-16 MED ORDER — FENTANYL 2.5 MCG/ML BUPIVACAINE 1/10 % EPIDURAL INFUSION (WH - ANES)
INTRAMUSCULAR | Status: DC | PRN
  Administered 2012-01-16: 14 mL/h via EPIDURAL

## 2012-01-16 MED ORDER — PHENYLEPHRINE 40 MCG/ML (10ML) SYRINGE FOR IV PUSH (FOR BLOOD PRESSURE SUPPORT)
80.0000 ug | PREFILLED_SYRINGE | INTRAVENOUS | Status: DC | PRN
  Filled 2012-01-16: qty 5

## 2012-01-16 MED ORDER — LABETALOL HCL 5 MG/ML IV SOLN
10.0000 mg | INTRAVENOUS | Status: DC | PRN
  Filled 2012-01-16: qty 4

## 2012-01-16 MED ORDER — IBUPROFEN 600 MG PO TABS: 600.0000 mg | ORAL_TABLET | Freq: Four times a day (QID) | ORAL | Status: DC | PRN

## 2012-01-16 MED ORDER — EPHEDRINE 5 MG/ML INJ
10.0000 mg | INTRAVENOUS | Status: DC | PRN
  Filled 2012-01-16: qty 4

## 2012-01-16 MED ORDER — EPHEDRINE 5 MG/ML INJ: 10.0000 mg | INTRAVENOUS | Status: DC | PRN

## 2012-01-16 MED ORDER — FLEET ENEMA 7-19 GM/118ML RE ENEM: 1.0000 | ENEMA | RECTAL | Status: DC | PRN

## 2012-01-16 MED ORDER — OXYCODONE-ACETAMINOPHEN 5-325 MG PO TABS: 1.0000 | ORAL_TABLET | ORAL | Status: DC | PRN

## 2012-01-16 MED ORDER — LACTATED RINGERS IV SOLN
500.0000 mL | INTRAVENOUS | Status: DC | PRN
  Administered 2012-01-16: 500 mL via INTRAVENOUS
  Administered 2012-01-16: 1000 mL via INTRAVENOUS

## 2012-01-16 MED ORDER — LACTATED RINGERS IV SOLN
INTRAVENOUS | Status: DC
  Administered 2012-01-16 (×2): via INTRAVENOUS

## 2012-01-16 MED ORDER — ONDANSETRON HCL 4 MG/2ML IJ SOLN: 4.0000 mg | Freq: Four times a day (QID) | INTRAMUSCULAR | Status: DC | PRN

## 2012-01-16 MED ORDER — OXYTOCIN 40 UNITS IN LACTATED RINGERS INFUSION - SIMPLE MED: 1.0000 m[IU]/min | INTRAVENOUS | Status: DC

## 2012-01-16 MED ORDER — BUTORPHANOL TARTRATE 1 MG/ML IJ SOLN
1.0000 mg | INTRAMUSCULAR | Status: DC | PRN
  Administered 2012-01-16 (×3): 1 mg via INTRAVENOUS
  Filled 2012-01-16 (×3): qty 1

## 2012-01-16 MED ORDER — LIDOCAINE HCL (PF) 1 % IJ SOLN: 30.0000 mL | INTRAMUSCULAR | Status: DC | PRN

## 2012-01-16 MED ORDER — SODIUM BICARBONATE 8.4 % IV SOLN
INTRAVENOUS | Status: DC | PRN
  Administered 2012-01-16: 4 mL via EPIDURAL

## 2012-01-16 MED ORDER — FENTANYL 2.5 MCG/ML BUPIVACAINE 1/10 % EPIDURAL INFUSION (WH - ANES)
14.0000 mL/h | INTRAMUSCULAR | Status: DC
  Administered 2012-01-16 (×3): 14 mL/h via EPIDURAL
  Filled 2012-01-16 (×4): qty 60

## 2012-01-16 MED ORDER — LACTATED RINGERS IV SOLN: 500.0000 mL | Freq: Once | INTRAVENOUS | Status: DC

## 2012-01-16 MED ORDER — OXYTOCIN 40 UNITS IN LACTATED RINGERS INFUSION - SIMPLE MED
1.0000 m[IU]/min | INTRAVENOUS | Status: DC
  Administered 2012-01-16: 1 m[IU]/min via INTRAVENOUS
  Administered 2012-01-16: 3 m[IU]/min via INTRAVENOUS
  Filled 2012-01-16: qty 1000

## 2012-01-16 MED ORDER — TERBUTALINE SULFATE 1 MG/ML IJ SOLN
0.2500 mg | Freq: Once | INTRAMUSCULAR | Status: AC | PRN
  Filled 2012-01-16: qty 1

## 2012-01-16 MED ORDER — ACETAMINOPHEN 325 MG PO TABS
650.0000 mg | ORAL_TABLET | ORAL | Status: DC | PRN
  Administered 2012-01-16: 650 mg via ORAL
  Filled 2012-01-16: qty 2

## 2012-01-16 NOTE — Anesthesia Procedure Notes (Signed)

## 2012-01-16 NOTE — Progress Notes (Signed)
History  Kelly Chan is a 21 y.o. G1P0 at [redacted]w[redacted]d   Subjective:  visual spot time x since getting here, swelling to face, hands, ankles, uc since 0300, denies srom, vag bleeding except smear with wiping, no headaches or upper abd pain  Chief Complaint  Patient presents with  . Labor Eval     Vitals:  Blood pressure 126/92, temperature 98.8 F (37.1 C), temperature source Oral, resp. rate 20, height 5\' 5"  (1.651 m), weight 220 lb (99.791 kg), last menstrual period 04/08/2011, SpO2 100.00%. OB History    Grav Para Term Preterm Abortions TAB SAB Ect Mult Living   1         0      Past Medical History  Diagnosis Date  . Gonorrhea   . BV (bacterial vaginosis)   . Abnormal Pap smear 06/2011  . H/O candidiasis   . History of bacterial infection   . Yeast infection     Past Surgical History  Procedure Date  . Wisdom tooth extraction   . No past surgeries     Family History  Problem Relation Age of Onset  . Cancer Maternal Aunt     Breast   . Cancer Maternal Aunt     Breast  . Hypertension Maternal Grandmother   . Other Neg Hx     History  Substance Use Topics  . Smoking status: Former Smoker -- 0.2 packs/day    Quit date: 05/25/2011  . Smokeless tobacco: Never Used   Comment: "I've quit because I can't tolerate the smell anymore."  . Alcohol Use: No    Allergies: No Known Allergies  Prescriptions prior to admission  Medication Sig Dispense Refill  . calcium carbonate (TUMS - DOSED IN MG ELEMENTAL CALCIUM) 500 MG chewable tablet Chew 2 tablets by mouth 3 (three) times daily as needed. For heartburn      . Multiple Vitamin (MULTIVITAMIN WITH MINERALS) TABS Take 1 tablet by mouth daily.      . Multiple Vitamins-Minerals (MULTIVITAMIN & MINERAL PO) Take by mouth.       Physical Exam  Calm, no distress, HEENT WNL grossly, no thyroid megally, lungs clear bilaterally, AP RRR, abd soft nt,no masses, not tympanic bowel sounds active, abdomen nontender, DTRS  bilaterally + 1 no clonus +1 nonpitting edema to lower extremities fhts category 1 uc q 1-6 mild Vag 1 90 -1 VTX Intact Blood pressure 126/92, temperature 98.8 F (37.1 C), temperature source Oral, resp. rate 20, height 5\' 5"  (1.651 m), weight 220 lb (99.791 kg), last menstrual period 04/08/2011, SpO2 100.00%. @PHYSEXAMBYAGE2 @ Labs:  No results found for this or any previous visit (from the past 24 hour(s)). Medication List  As of 01/16/2012  6:38 AM   ASK your doctor about these medications         calcium carbonate 500 MG chewable tablet   Commonly known as: TUMS - dosed in mg elemental calcium   Chew 2 tablets by mouth 3 (three) times daily as needed. For heartburn      MULTIVITAMIN & MINERAL PO   Take by mouth.      multivitamin with minerals Tabs   Take 1 tablet by mouth daily.           ASSESSMENT: Patient Active Problem List  Diagnosis  . Echogenic focus of heart of fetus affecting antepartum care of mother  . Ovarian cyst, left  . Hx of gonorrhea  . Hx of chlamydia infection  . Marijuana use  .  History of smoking  . Prenatal care, first pregnancy  . Pain of round ligament   Physical Examination:  General appearance - alert, well appearing, and in no distress Physical exam: Calm, no distress, HEENT wnl lungs clear bilaterally, AP RRR, abd soft, gravid, nt, bowel sounds active, abdomen nontender Fundal height... Normal hair distrubition mons pubis,  EGBUS WNL, sterile speculum exam,  vagina pink, moist normal rugae,  .weeks size Vag  DTR +... no clonus No edema to lower extremities B pos Rubella Immune Hep B neg HIV neg RPR NR GBS neg, GC neg, CHL neg  ED Course  Assessment/Plan [redacted]w[redacted]d elevated bp, Not in labor PIH evaluation, labs pending, discussed elevated bp in pg risk of pre eclampsia, collaboration with Dr. Stefano Gaul. Lavera Guise, CNM

## 2012-01-16 NOTE — Progress Notes (Signed)
  Subjective: Comfortable with epidural.  ? Leaking x 1.  Objective: BP 157/86  Pulse 126  Temp 98.1 F (36.7 C) (Oral)  Resp 20  Ht 5\' 5"  (1.651 m)  Wt 220 lb (99.791 kg)  BMI 36.61 kg/m2  SpO2 99%  LMP 04/08/2011     Filed Vitals:   01/16/12 1450 01/16/12 1451 01/16/12 1455 01/16/12 1456  BP:  152/91  157/86  Pulse: 121 115 113 126  Temp:      TempSrc:      Resp:      Height:      Weight:      SpO2: 99%  99%      FHT:  Category 1 UC:   q 2-3 min SVE:   Dilation: 2 Effacement (%): 100 Station: -1 Exam by:: Noeh Sparacino cnm Attempted to AROM--small amount clear fluid with bloody show. Able to stretch cervix and diminish tightness of cervix. Cervix much more anterior. Pitocin on 4 mu/min.  Labs: Lab Results  Component Value Date   WBC 7.9 01/16/2012   HGB 12.3 01/16/2012   HCT 37.0 01/16/2012   MCV 80.6 01/16/2012   PLT 226 01/16/2012    Assessment / Plan: Early labor Gestational hypertension 24 hour urine in process.  Demetrius Mahler 01/16/2012, 3:10 PM

## 2012-01-16 NOTE — Progress Notes (Signed)
  Subjective: Has received several doses of Stadol--requests epidural.  Objective: BP 124/89  Pulse 94  Temp 98.1 F (36.7 C) (Oral)  Resp 20  Ht 5\' 5"  (1.651 m)  Wt 220 lb (99.791 kg)  BMI 36.61 kg/m2  SpO2 100%  LMP 04/08/2011      FHT:  Category 1 UC:   regular, every 2-3 minutes SVE:   2 cm, tight band around os, 90%, vtx -1. PItocin on 4 mu/min  Labs: Lab Results  Component Value Date   WBC 7.9 01/16/2012   HGB 12.3 01/16/2012   HCT 37.0 01/16/2012   MCV 80.6 01/16/2012   PLT 226 01/16/2012   UA negative for protein.  Assessment / Plan: Place epidural, then attempt to reduce tight band around os. Foley after epidural, to continue 24 hour urine. Anticipate AROM after epidural as labor progresses.  Kelly Chan 01/16/2012, 1:40 PM

## 2012-01-16 NOTE — Progress Notes (Signed)
Comfortable, some pressure O VSS      fhts category 1      abd soft between uc      Contractions q 2-3 120-140 MVU      Vag not assessed A inadquate UC P discussed infrequent exams with inadequate uc, laboring down discussed, continue care Lavera Guise, CNM

## 2012-01-16 NOTE — Anesthesia Preprocedure Evaluation (Signed)
Anesthesia Evaluation  Patient identified by MRN, date of birth, ID band Patient awake    Reviewed: Allergy & Precautions, H&P , Patient's Chart, lab work & pertinent test results  Airway Mallampati: II  TM Distance: >3 FB Neck ROM: full    Dental  (+) Teeth Intact   Pulmonary    breath sounds clear to auscultation       Cardiovascular  Rhythm:regular Rate:Normal     Neuro/Psych    GI/Hepatic   Endo/Other  Morbid obesity  Renal/GU      Musculoskeletal   Abdominal   Peds  Hematology   Anesthesia Other Findings       Reproductive/Obstetrics (+) Pregnancy                            Anesthesia Physical Anesthesia Plan  ASA: II  Anesthesia Plan: Epidural   Post-op Pain Management:    Induction:   Airway Management Planned:   Additional Equipment:   Intra-op Plan:   Post-operative Plan:   Informed Consent: I have reviewed the patients History and Physical, chart, labs and discussed the procedure including the risks, benefits and alternatives for the proposed anesthesia with the patient or authorized representative who has indicated his/her understanding and acceptance.   Dental Advisory Given  Plan Discussed with:   Anesthesia Plan Comments: (Labs checked- platelets confirmed with RN in room. Fetal heart tracing, per RN, reported to be stable enough for sitting procedure. Discussed epidural, and patient consents to the procedure:  included risk of possible headache,backache, failed block, allergic reaction, and nerve injury. This patient was asked if she had any questions or concerns before the procedure started.)        Anesthesia Quick Evaluation  

## 2012-01-16 NOTE — Progress Notes (Signed)
  Subjective: Comfortable, but has slight increase in discomfort on right side.  Objective: BP 113/79  Pulse 110  Temp 97.7 F (36.5 C) (Oral)  Resp 20  Ht 5\' 5"  (1.651 m)  Wt 220 lb (99.791 kg)  BMI 36.61 kg/m2  SpO2 99%  LMP 04/08/2011     Filed Vitals:   01/16/12 1735 01/16/12 1801 01/16/12 1831 01/16/12 1901  BP:  122/83 117/80 113/79  Pulse:  104 104 110  Temp: 97.9 F (36.6 C)   97.7 F (36.5 C)  TempSrc:      Resp:      Height:      Weight:      SpO2:         FHT: Category 1 UC:   q 3 min SVE:   Dilation: 3 Effacement (%): 90 Station: -1 Exam by:: Manfred Arch, CNM Still no leaking of fluid, but no membranes palpated. Attempted again to use amnihook to ensure ROM, but no fluid noted. Cervix unchanged.  Small amount dark bloody show noted. IUPC inserted. Pitocin on 7 mu/min  Labs: Lab Results  Component Value Date   WBC 7.9 01/16/2012   HGB 12.3 01/16/2012   HCT 37.0 01/16/2012   MCV 80.6 01/16/2012   PLT 226 01/16/2012    Assessment / Plan: Early labor Stable BP Will continue to observe.  Nigel Bridgeman 01/16/2012, 7:20 PM

## 2012-01-16 NOTE — MAU Note (Signed)
Requesting pain med

## 2012-01-16 NOTE — MAU Note (Signed)
C/o ucs since 0330

## 2012-01-16 NOTE — H&P (Signed)
Kelly Chan is a 21 y.o. female, G1P0 at 77 5/7 weeks, presenting for contractions last 2 days, worsening early this am.  She was seen on 8/19 for prodromal labor, with cervix FT, 90%.  She was sent home and had increased contractions this am.  She denies HA, but did have some visual spots this am--no epigastric pain.  Does report more swelling in feet and hands this week.  Denies leaking or bleeding, reports +FM.  Patient Active Problem List  Diagnosis  . Echogenic focus of heart of fetus affecting antepartum care of mother  . Ovarian cyst, left  . Hx of gonorrhea  . Hx of chlamydia infection  . Marijuana use  . History of smoking  . Prenatal care, first pregnancy  . Pain of round ligament  . Gestational hypertension  MJ use was confined to early 1st trimester  History of present pregnancy: Patient entered care at 9 weeks at [facility], then transferred to [facility] at 18 weeks.  EDC of 01/18/12 was established by LMP and was in agreement with [place] in 2nd trimester.  Anatomy scan was done at 18 weeks, with EIF noted and an posterior placenta.  Further ultrasounds were done at 24 weeks, with EIF still present and normal growth.  Her prenatal course was essentially uncomplicated. At her last evalution in MAU on 8/19, she was FT, 90%, with mild elevation of BP and prodromal labor.  OB History    Grav Para Term Preterm Abortions TAB SAB Ect Mult Living   1         0     Past Medical History  Diagnosis Date  . Gonorrhea   . BV (bacterial vaginosis)   . Abnormal Pap smear 06/2011  . H/O candidiasis   . History of bacterial infection   . Yeast infection    Past Surgical History  Procedure Date  . Wisdom tooth extraction   . No past surgeries    Family History: family history includes Cancer in her maternal aunts and Hypertension in her maternal grandmother.  There is no history of Other.  Social History:  reports that she quit smoking about 7 months ago. She has never used smokeless tobacco.  She reports that she does not drink alcohol or use illicit drugs.  She declines to name FOB.  She is accompanied by her grandmother, but advises her grandmother will be leaving for awhile--she anticipates her returning closer to delivery.  No one else is anticipated to be present during labor.  ROS:  Contractions, +FM.  No Known Allergies   Dilation: 2 Effacement (%): 90 Station: -1 Exam by:: Manfred Arch, CNM Blood pressure 133/96, pulse 90, temperature 98.3 F (36.8 C), temperature source Oral, resp. rate 20, height 5\' 5"  (1.651 m), weight 220 lb (99.791 kg), last menstrual period 04/08/2011, SpO2 100.00%.  Filed Vitals:   01/16/12 0533 01/16/12 0613 01/16/12 0651 01/16/12 0733  BP: 136/101 126/92 130/92 133/96  Pulse:   94 90  Temp: 98.8 F (37.1 C)   98.3 F (36.8 C)  TempSrc: Oral   Oral  Resp: 20   20  Height: 5\' 5"  (1.651 m)     Weight: 220 lb (99.791 kg)     SpO2: 100%        Chest clear Heart RRR without murmur Abd gravid, NT, FH 39 cm Pelvic: very posterior, 2 cm, 90%, vtx, -1 Ext: 2+ edema, DTR 1+ without clonus  FHR: Category 1 UCs:  q 3-4 min  Prenatal  labs: ABO, Rh: --/--/B POS (12/15 1345) Antibody:  Neg Rubella:   Immune RPR: NON REAC (05/31 1159)  HBsAg:   Neg HIV:   NR GBS: NEGATIVE (07/24 0941) Sickle cell/Hgb electrophoresis:  WHL Pap:  NA GC:  Negative at NOB and at 36 weeks Chlamydia:  Negative at NOB and at 36 weeks Genetic screenings:  Declined Glucola:  WNL Other:  Varicella equivocal Hgb 13.8 at NOB, 12.3 at 28 weeks    Results for orders placed during the hospital encounter of 01/16/12 (from the past 24 hour(s))  URIC ACID     Status: Normal   Collection Time   01/16/12  6:40 AM      Component Value Range   Uric Acid, Serum 4.8  2.4 - 7.0 mg/dL  CBC WITH DIFFERENTIAL     Status: Normal   Collection Time   01/16/12  6:40 AM      Component Value Range   WBC 7.9  4.0 - 10.5 K/uL   RBC 4.59  3.87 - 5.11 MIL/uL   Hemoglobin 12.3   12.0 - 15.0 g/dL   HCT 16.1  09.6 - 04.5 %   MCV 80.6  78.0 - 100.0 fL   MCH 26.8  26.0 - 34.0 pg   MCHC 33.2  30.0 - 36.0 g/dL   RDW 40.9  81.1 - 91.4 %   Platelets 226  150 - 400 K/uL   Neutrophils Relative 69  43 - 77 %   Neutro Abs 5.4  1.7 - 7.7 K/uL   Lymphocytes Relative 20  12 - 46 %   Lymphs Abs 1.6  0.7 - 4.0 K/uL   Monocytes Relative 10  3 - 12 %   Monocytes Absolute 0.8  0.1 - 1.0 K/uL   Eosinophils Relative 1  0 - 5 %   Eosinophils Absolute 0.1  0.0 - 0.7 K/uL   Basophils Relative 0  0 - 1 %   Basophils Absolute 0.0  0.0 - 0.1 K/uL  LACTATE DEHYDROGENASE     Status: Normal   Collection Time   01/16/12  6:40 AM      Component Value Range   LDH 150  94 - 250 U/L  COMPREHENSIVE METABOLIC PANEL     Status: Abnormal   Collection Time   01/16/12  6:40 AM      Component Value Range   Sodium 135  135 - 145 mEq/L   Potassium 4.1  3.5 - 5.1 mEq/L   Chloride 102  96 - 112 mEq/L   CO2 20  19 - 32 mEq/L   Glucose, Bld 94  70 - 99 mg/dL   BUN 10  6 - 23 mg/dL   Creatinine, Ser 7.82  0.50 - 1.10 mg/dL   Calcium 9.7  8.4 - 95.6 mg/dL   Total Protein 6.1  6.0 - 8.3 g/dL   Albumin 2.9 (*) 3.5 - 5.2 g/dL   AST 15  0 - 37 U/L   ALT 8  0 - 35 U/L   Alkaline Phosphatase 140 (*) 39 - 117 U/L   Total Bilirubin 0.3  0.3 - 1.2 mg/dL   GFR calc non Af Amer >90  >90 mL/min   GFR calc Af Amer >90  >90 mL/min   UA pending  Assessment/Plan: IUP at 39 5/7 weeks Early labor/prolonged prodromal labor Elevated BP  Plan: Admit to [facility] per consult with Dr. Estanislado Pandy Routine [facility] orders Plan pitocin augmentation, then AROM as appropriate Epidural prn Start  24 hour urine now, continue when foley placed Labetalol prn for elevated BP  Cylus Douville, [place], [place] 01/16/2012, 7:56 AM

## 2012-01-16 NOTE — Progress Notes (Signed)
Comfortable O VSS      fhts category 1      abd soft between uc q 1-3      Contractions 100 MVU      Vag not assessed A inadequate uc, PIH induction P encouraged to sleep, Dr. Estanislado Pandy updated on pt status at 2200, continue care Kelly Chan, CNM

## 2012-01-17 ENCOUNTER — Encounter (HOSPITAL_COMMUNITY): Payer: Self-pay | Admitting: Anesthesiology

## 2012-01-17 ENCOUNTER — Encounter (HOSPITAL_COMMUNITY)
Admission: AD | Disposition: A | Discharge status: Home / Self Care | Payer: Self-pay | Source: Ambulatory Visit | Attending: Obstetrics and Gynecology

## 2012-01-17 ENCOUNTER — Inpatient Hospital Stay (HOSPITAL_COMMUNITY): Payer: BC Managed Care – PPO | Admitting: Anesthesiology

## 2012-01-17 ENCOUNTER — Encounter (HOSPITAL_COMMUNITY): Payer: Self-pay | Admitting: Obstetrics

## 2012-01-17 LAB — COMPREHENSIVE METABOLIC PANEL
AST: 17 U/L (ref 0–37)
Albumin: 2.4 g/dL — ABNORMAL LOW (ref 3.5–5.2)
Alkaline Phosphatase: 107 U/L (ref 39–117)
BUN: 7 mg/dL (ref 6–23)
Chloride: 103 mEq/L (ref 96–112)
Creatinine, Ser: 0.66 mg/dL (ref 0.50–1.10)
Potassium: 3.7 mEq/L (ref 3.5–5.1)
Total Bilirubin: 0.6 mg/dL (ref 0.3–1.2)
Total Protein: 4.8 g/dL — ABNORMAL LOW (ref 6.0–8.3)

## 2012-01-17 LAB — CREATININE CLEARANCE, URINE, 24 HOUR
Creatinine Clearance: 131 mL/min — ABNORMAL HIGH (ref 75–115)
Creatinine, Urine: 35.29 mg/dL
Creatinine: 0.66 mg/dL (ref 0.50–1.10)

## 2012-01-17 LAB — CBC
HCT: 30 % — ABNORMAL LOW (ref 36.0–46.0)
MCHC: 34.3 g/dL (ref 30.0–36.0)
Platelets: 203 10*3/uL (ref 150–400)
RDW: 14.9 % (ref 11.5–15.5)
WBC: 13.2 10*3/uL — ABNORMAL HIGH (ref 4.0–10.5)

## 2012-01-17 LAB — URIC ACID: Uric Acid, Serum: 5.6 mg/dL (ref 2.4–7.0)

## 2012-01-17 LAB — CCBB MATERNAL DONOR DRAW

## 2012-01-17 SURGERY — Surgical Case
Anesthesia: Epidural | Site: Abdomen | Wound class: Clean Contaminated

## 2012-01-17 MED ORDER — METOCLOPRAMIDE HCL 5 MG/ML IJ SOLN: 10.0000 mg | Freq: Three times a day (TID) | INTRAMUSCULAR | Status: DC | PRN

## 2012-01-17 MED ORDER — MORPHINE SULFATE 0.5 MG/ML IJ SOLN
INTRAMUSCULAR | Status: AC
  Filled 2012-01-17: qty 10

## 2012-01-17 MED ORDER — DIPHENHYDRAMINE HCL 25 MG PO CAPS: 25.0000 mg | ORAL_CAPSULE | Freq: Four times a day (QID) | ORAL | Status: DC | PRN

## 2012-01-17 MED ORDER — BUPIVACAINE HCL (PF) 0.25 % IJ SOLN
INTRAMUSCULAR | Status: AC
  Filled 2012-01-17: qty 30

## 2012-01-17 MED ORDER — KETOROLAC TROMETHAMINE 60 MG/2ML IM SOLN
60.0000 mg | Freq: Once | INTRAMUSCULAR | Status: AC | PRN
  Administered 2012-01-17: 60 mg via INTRAMUSCULAR

## 2012-01-17 MED ORDER — KETOROLAC TROMETHAMINE 30 MG/ML IJ SOLN: 15.0000 mg | Freq: Once | INTRAMUSCULAR | Status: DC | PRN

## 2012-01-17 MED ORDER — ZOLPIDEM TARTRATE 5 MG PO TABS: 5.0000 mg | ORAL_TABLET | Freq: Every evening | ORAL | Status: DC | PRN

## 2012-01-17 MED ORDER — SIMETHICONE 80 MG PO CHEW
80.0000 mg | CHEWABLE_TABLET | Freq: Three times a day (TID) | ORAL | Status: DC
  Administered 2012-01-17 – 2012-01-19 (×9): 80 mg via ORAL

## 2012-01-17 MED ORDER — MEPERIDINE HCL 25 MG/ML IJ SOLN
INTRAMUSCULAR | Status: AC
  Filled 2012-01-17: qty 1

## 2012-01-17 MED ORDER — ONDANSETRON HCL 4 MG/2ML IJ SOLN
INTRAMUSCULAR | Status: AC
  Filled 2012-01-17: qty 2

## 2012-01-17 MED ORDER — SODIUM CHLORIDE 0.9 % IJ SOLN: 3.0000 mL | INTRAMUSCULAR | Status: DC | PRN

## 2012-01-17 MED ORDER — LABETALOL HCL 5 MG/ML IV SOLN
INTRAVENOUS | Status: AC
  Filled 2012-01-17: qty 4

## 2012-01-17 MED ORDER — DIBUCAINE 1 % RE OINT: 1.0000 "application " | TOPICAL_OINTMENT | RECTAL | Status: DC | PRN

## 2012-01-17 MED ORDER — LANOLIN HYDROUS EX OINT: 1.0000 "application " | TOPICAL_OINTMENT | CUTANEOUS | Status: DC | PRN

## 2012-01-17 MED ORDER — FENTANYL CITRATE 0.05 MG/ML IJ SOLN
INTRAMUSCULAR | Status: AC
  Filled 2012-01-17: qty 2

## 2012-01-17 MED ORDER — ONDANSETRON HCL 4 MG/2ML IJ SOLN: 4.0000 mg | Freq: Three times a day (TID) | INTRAMUSCULAR | Status: DC | PRN

## 2012-01-17 MED ORDER — SENNOSIDES-DOCUSATE SODIUM 8.6-50 MG PO TABS
2.0000 | ORAL_TABLET | Freq: Every day | ORAL | Status: DC
  Administered 2012-01-17 – 2012-01-18 (×2): 2 via ORAL

## 2012-01-17 MED ORDER — MENTHOL 3 MG MT LOZG
1.0000 | LOZENGE | OROMUCOSAL | Status: DC | PRN
  Administered 2012-01-17: 3 mg via ORAL
  Filled 2012-01-17: qty 9

## 2012-01-17 MED ORDER — ONDANSETRON HCL 4 MG PO TABS: 4.0000 mg | ORAL_TABLET | ORAL | Status: DC | PRN

## 2012-01-17 MED ORDER — SCOPOLAMINE 1 MG/3DAYS TD PT72
1.0000 | MEDICATED_PATCH | Freq: Once | TRANSDERMAL | Status: DC
  Administered 2012-01-17: 1.5 mg via TRANSDERMAL

## 2012-01-17 MED ORDER — NALBUPHINE SYRINGE 5 MG/0.5 ML
5.0000 mg | INJECTION | INTRAMUSCULAR | Status: DC | PRN
  Administered 2012-01-17 (×2): 5 mg via INTRAVENOUS
  Filled 2012-01-17: qty 1
  Filled 2012-01-17: qty 0.5
  Filled 2012-01-17: qty 1

## 2012-01-17 MED ORDER — FENTANYL CITRATE 0.05 MG/ML IJ SOLN
INTRAMUSCULAR | Status: DC | PRN
  Administered 2012-01-17: 100 ug via INTRAVENOUS

## 2012-01-17 MED ORDER — MORPHINE SULFATE (PF) 0.5 MG/ML IJ SOLN
INTRAMUSCULAR | Status: DC | PRN
  Administered 2012-01-17: 4 mg via EPIDURAL
  Administered 2012-01-17: 1 mg via INTRAVENOUS

## 2012-01-17 MED ORDER — NALBUPHINE SYRINGE 5 MG/0.5 ML
5.0000 mg | INJECTION | INTRAMUSCULAR | Status: DC | PRN
  Filled 2012-01-17: qty 1

## 2012-01-17 MED ORDER — WITCH HAZEL-GLYCERIN EX PADS: 1.0000 "application " | MEDICATED_PAD | CUTANEOUS | Status: DC | PRN

## 2012-01-17 MED ORDER — KETOROLAC TROMETHAMINE 30 MG/ML IJ SOLN: 30.0000 mg | Freq: Four times a day (QID) | INTRAMUSCULAR | Status: AC | PRN

## 2012-01-17 MED ORDER — CEFAZOLIN SODIUM-DEXTROSE 2-3 GM-% IV SOLR
2.0000 g | Freq: Three times a day (TID) | INTRAVENOUS | Status: DC
  Administered 2012-01-17: 2 g via INTRAVENOUS
  Filled 2012-01-17 (×2): qty 50

## 2012-01-17 MED ORDER — IBUPROFEN 600 MG PO TABS
600.0000 mg | ORAL_TABLET | Freq: Four times a day (QID) | ORAL | Status: DC
  Administered 2012-01-17 – 2012-01-19 (×8): 600 mg via ORAL
  Filled 2012-01-17 (×8): qty 1

## 2012-01-17 MED ORDER — METHYLERGONOVINE MALEATE 0.2 MG/ML IJ SOLN: 0.2000 mg | INTRAMUSCULAR | Status: DC | PRN

## 2012-01-17 MED ORDER — TETANUS-DIPHTH-ACELL PERTUSSIS 5-2.5-18.5 LF-MCG/0.5 IM SUSP: 0.5000 mL | Freq: Once | INTRAMUSCULAR | Status: DC

## 2012-01-17 MED ORDER — ONDANSETRON HCL 4 MG/2ML IJ SOLN: 4.0000 mg | INTRAMUSCULAR | Status: DC | PRN

## 2012-01-17 MED ORDER — MEPERIDINE HCL 25 MG/ML IJ SOLN
6.2500 mg | INTRAMUSCULAR | Status: DC | PRN
  Administered 2012-01-17: 6.25 mg via INTRAVENOUS

## 2012-01-17 MED ORDER — OXYCODONE-ACETAMINOPHEN 5-325 MG PO TABS
1.0000 | ORAL_TABLET | ORAL | Status: DC | PRN
  Administered 2012-01-17 – 2012-01-19 (×8): 1 via ORAL
  Filled 2012-01-17 (×8): qty 1

## 2012-01-17 MED ORDER — SODIUM CHLORIDE 0.9 % IV SOLN
1.0000 ug/kg/h | INTRAVENOUS | Status: DC | PRN
  Filled 2012-01-17: qty 2.5

## 2012-01-17 MED ORDER — BUPIVACAINE HCL (PF) 0.25 % IJ SOLN
INTRAMUSCULAR | Status: DC | PRN
  Administered 2012-01-17: 20 mL

## 2012-01-17 MED ORDER — METHYLERGONOVINE MALEATE 0.2 MG PO TABS: 0.2000 mg | ORAL_TABLET | ORAL | Status: DC | PRN

## 2012-01-17 MED ORDER — PROMETHAZINE HCL 25 MG/ML IJ SOLN: 6.2500 mg | INTRAMUSCULAR | Status: DC | PRN

## 2012-01-17 MED ORDER — CEFAZOLIN SODIUM-DEXTROSE 2-3 GM-% IV SOLR
INTRAVENOUS | Status: AC
  Filled 2012-01-17: qty 50

## 2012-01-17 MED ORDER — NALOXONE HCL 0.4 MG/ML IJ SOLN: 0.4000 mg | INTRAMUSCULAR | Status: DC | PRN

## 2012-01-17 MED ORDER — LACTATED RINGERS IV SOLN
INTRAVENOUS | Status: DC | PRN
  Administered 2012-01-17: via INTRAVENOUS

## 2012-01-17 MED ORDER — KETOROLAC TROMETHAMINE 60 MG/2ML IM SOLN
INTRAMUSCULAR | Status: AC
  Filled 2012-01-17: qty 2

## 2012-01-17 MED ORDER — DIPHENHYDRAMINE HCL 25 MG PO CAPS
25.0000 mg | ORAL_CAPSULE | ORAL | Status: DC | PRN
  Administered 2012-01-17 – 2012-01-18 (×3): 25 mg via ORAL
  Filled 2012-01-17 (×3): qty 1

## 2012-01-17 MED ORDER — SCOPOLAMINE 1 MG/3DAYS TD PT72
MEDICATED_PATCH | TRANSDERMAL | Status: AC
  Filled 2012-01-17: qty 1

## 2012-01-17 MED ORDER — SIMETHICONE 80 MG PO CHEW: 80.0000 mg | CHEWABLE_TABLET | ORAL | Status: DC | PRN

## 2012-01-17 MED ORDER — PRENATAL MULTIVITAMIN CH
1.0000 | ORAL_TABLET | Freq: Every day | ORAL | Status: DC
  Administered 2012-01-17 – 2012-01-19 (×3): 1 via ORAL
  Filled 2012-01-17 (×3): qty 1

## 2012-01-17 MED ORDER — HYDROMORPHONE HCL PF 1 MG/ML IJ SOLN: 0.2500 mg | INTRAMUSCULAR | Status: DC | PRN

## 2012-01-17 MED ORDER — LABETALOL HCL 5 MG/ML IV SOLN
10.0000 mg | Freq: Once | INTRAVENOUS | Status: AC
  Administered 2012-01-17: 10 mg via INTRAVENOUS

## 2012-01-17 MED ORDER — ADULT MULTIVITAMIN W/MINERALS CH: 1.0000 | ORAL_TABLET | Freq: Every day | ORAL | Status: DC

## 2012-01-17 MED ORDER — FERROUS SULFATE 325 (65 FE) MG PO TABS
325.0000 mg | ORAL_TABLET | Freq: Two times a day (BID) | ORAL | Status: DC
  Administered 2012-01-17 – 2012-01-19 (×5): 325 mg via ORAL
  Filled 2012-01-17 (×5): qty 1

## 2012-01-17 MED ORDER — OXYTOCIN 40 UNITS IN LACTATED RINGERS INFUSION - SIMPLE MED: 62.5000 mL/h | INTRAVENOUS | Status: DC

## 2012-01-17 MED ORDER — OXYTOCIN 10 UNIT/ML IJ SOLN
40.0000 [IU] | INTRAVENOUS | Status: DC | PRN
  Administered 2012-01-17: 40 [IU] via INTRAVENOUS

## 2012-01-17 MED ORDER — DIPHENHYDRAMINE HCL 50 MG/ML IJ SOLN: 12.5000 mg | INTRAMUSCULAR | Status: DC | PRN

## 2012-01-17 MED ORDER — LACTATED RINGERS IV SOLN
INTRAVENOUS | Status: DC
  Administered 2012-01-17 (×2): via INTRAVENOUS

## 2012-01-17 MED ORDER — SODIUM BICARBONATE 8.4 % IV SOLN
INTRAVENOUS | Status: AC
  Filled 2012-01-17: qty 50

## 2012-01-17 MED ORDER — MEASLES, MUMPS & RUBELLA VAC ~~LOC~~ INJ
0.5000 mL | INJECTION | Freq: Once | SUBCUTANEOUS | Status: DC
  Filled 2012-01-17: qty 0.5

## 2012-01-17 MED ORDER — ONDANSETRON HCL 4 MG/2ML IJ SOLN
INTRAMUSCULAR | Status: DC | PRN
  Administered 2012-01-17: 4 mg via INTRAVENOUS

## 2012-01-17 MED ORDER — LIDOCAINE-EPINEPHRINE (PF) 2 %-1:200000 IJ SOLN
INTRAMUSCULAR | Status: AC
  Filled 2012-01-17: qty 20

## 2012-01-17 MED ORDER — DIPHENHYDRAMINE HCL 50 MG/ML IJ SOLN: 25.0000 mg | INTRAMUSCULAR | Status: DC | PRN

## 2012-01-17 SURGICAL SUPPLY — 36 items
BENZOIN TINCTURE PRP APPL 2/3 (GAUZE/BANDAGES/DRESSINGS) ×2 IMPLANT
BOOTIES KNEE HIGH SLOAN (MISCELLANEOUS) ×4 IMPLANT
CHLORAPREP W/TINT 26ML (MISCELLANEOUS) ×2 IMPLANT
CLOTH BEACON ORANGE TIMEOUT ST (SAFETY) ×2 IMPLANT
DRAIN JACKSON PRT FLT 10 (DRAIN) IMPLANT
DRESSING TELFA 8X3 (GAUZE/BANDAGES/DRESSINGS) ×2 IMPLANT
DRSG COVADERM 4X10 (GAUZE/BANDAGES/DRESSINGS) ×2 IMPLANT
ELECT REM PT RETURN 9FT ADLT (ELECTROSURGICAL) ×2
ELECTRODE REM PT RTRN 9FT ADLT (ELECTROSURGICAL) ×1 IMPLANT
EVACUATOR SILICONE 100CC (DRAIN) IMPLANT
EXTRACTOR VACUUM M CUP 4 TUBE (SUCTIONS) IMPLANT
GAUZE SPONGE 4X4 12PLY STRL LF (GAUZE/BANDAGES/DRESSINGS) ×4 IMPLANT
GLOVE BIOGEL PI IND STRL 7.0 (GLOVE) ×2 IMPLANT
GLOVE BIOGEL PI INDICATOR 7.0 (GLOVE) ×2
GLOVE ECLIPSE 6.5 STRL STRAW (GLOVE) ×2 IMPLANT
GOWN PREVENTION PLUS LG XLONG (DISPOSABLE) ×6 IMPLANT
KIT ABG SYR 3ML LUER SLIP (SYRINGE) ×2 IMPLANT
NEEDLE HYPO 22GX1.5 SAFETY (NEEDLE) ×2 IMPLANT
NEEDLE HYPO 25X5/8 SAFETYGLIDE (NEEDLE) ×2 IMPLANT
NS IRRIG 1000ML POUR BTL (IV SOLUTION) ×4 IMPLANT
PACK C SECTION WH (CUSTOM PROCEDURE TRAY) ×2 IMPLANT
PAD ABD 7.5X8 STRL (GAUZE/BANDAGES/DRESSINGS) ×2 IMPLANT
PAD OB MATERNITY 4.3X12.25 (PERSONAL CARE ITEMS) ×2 IMPLANT
RTRCTR C-SECT PINK 25CM LRG (MISCELLANEOUS) ×2 IMPLANT
SLEEVE SCD COMPRESS KNEE MED (MISCELLANEOUS) ×2 IMPLANT
STRIP CLOSURE SKIN 1/2X4 (GAUZE/BANDAGES/DRESSINGS) ×2 IMPLANT
SUT CHROMIC GUT AB #0 18 (SUTURE) IMPLANT
SUT MNCRL AB 3-0 PS2 27 (SUTURE) ×2 IMPLANT
SUT SILK 2 0 FSL 18 (SUTURE) IMPLANT
SUT VIC AB 0 CTX 36 (SUTURE) ×2
SUT VIC AB 0 CTX36XBRD ANBCTRL (SUTURE) ×2 IMPLANT
SUT VIC AB 1 CT1 36 (SUTURE) ×4 IMPLANT
SYR 20CC LL (SYRINGE) ×2 IMPLANT
TOWEL OR 17X24 6PK STRL BLUE (TOWEL DISPOSABLE) ×4 IMPLANT
TRAY FOLEY CATH 14FR (SET/KITS/TRAYS/PACK) IMPLANT
WATER STERILE IRR 1000ML POUR (IV SOLUTION) IMPLANT

## 2012-01-17 NOTE — Progress Notes (Addendum)
Subjective: Postpartum Day 1 s/p Cesarean Delivery secondary to:  Patient reports tolerating PO, + flatus and no problems voiding.   no complaints, up ad lib without syncope Pain well controlled with po meds BF attempted but not ready that intersted at the moment. Would like to use formula Rush Barer). Mood stable, bonding well Contraception: undecided today. Will discuss tomorrow.   Objective: Vital signs in last 24 hours: Temp:  [97.7 F (36.5 C)-100.1 F (37.8 C)] 98 F (36.7 C) (08/22 0545) Pulse Rate:  [88-173] 130  (08/22 0545) Resp:  [16-24] 20  (08/22 0545) BP: (112-157)/(66-104) 114/79 mmHg (08/22 0545) SpO2:  [98 %-100 %] 99 % (08/22 0545)  Physical Exam:  General: alert, cooperative and no distress Breasts: normal Heart: RRR Lungs: CTAB Abdomen: BS x4 Uterine Fundus: firm Incision: healing well, no significant drainage, dressing can be taken down later today after shower this evening.  Lochia: appropriate DVT Evaluation: No evidence of DVT seen on physical exam. Negative Homan's sign. Calf/Ankle edema is present. Early ambulation recommended as patient will not were SCD stockings. Mild postpartum anemia. Advised po Iron daily to assist with recovery.   Basename 01/17/12 0520 01/16/12 0640  HGB 10.3* 12.3  HCT 30.0* 37.0    Assessment/Plan: Status post Cesarean section. Doing well postoperatively.  Pulse ox removed. Later today to have IV access to [place] and remove in am if condition stable. Continue current care.  DAVIES, DENISE, CNM. 01/17/2012, 8:27 AM   Pt seen for persistent tachycardia.  Her exam is stable.  Continue IVF and monitor temperature curve

## 2012-01-17 NOTE — Addendum Note (Signed)
Addendum  created 01/17/12 0930 by Algis Greenhouse, CRNA   Modules edited:Notes Section

## 2012-01-17 NOTE — Transfer of Care (Signed)
Immediate Anesthesia Transfer of Care Note  Patient: Kelly Chan  Procedure(s) Performed: Procedure(s) (LRB): CESAREAN SECTION (N/A)  Patient Location: PACU  Anesthesia Type: Regional and Epidural  Level of Consciousness: awake, alert , oriented and patient cooperative  Airway & Oxygen Therapy: Patient Spontanous Breathing  Post-op Assessment: Report given to PACU RN, Post -op Vital signs reviewed and stable and Patient moving all extremities X 4  Post vital signs: Reviewed and stable  Complications: No apparent anesthesia complications

## 2012-01-17 NOTE — Anesthesia Postprocedure Evaluation (Signed)
  Anesthesia Post Note  Patient: Kelly Chan  Procedure(s) Performed: Procedure(s) (LRB): CESAREAN SECTION (N/A)  Anesthesia type: Epidural  Patient location: Mother/Baby  Post pain: Pain level controlled  Post assessment: Post-op Vital signs reviewed  Last Vitals:  Filed Vitals:   01/17/12 0545  BP: 114/79  Pulse: 130  Temp: 36.7 C  Resp: 20    Post vital signs: Reviewed  Level of consciousness:alert  Complications: No apparent anesthesia complications

## 2012-01-17 NOTE — Progress Notes (Signed)
Subjective: Postpartum Day 1 Cesarean Delivery secondary to: fTP Patient reports tolerating PO and + flatus.   no complaints, up ad lib without syncope, advised to be careful stretching and bending as not aware that lateral turning early post op is not advised. Nursing personnel have had to warn the patient to call for help and remember that she is only D1 Post op. Pain well controlled with po meds BF has no desire to breast feed and wishes to use formula. Rush Barer) Mood stable, bonding well Contraception:   Objective: Vital signs in last 24 hours: Temp:  [97.7 F (36.5 C)-100.1 F (37.8 C)] 98.7 F (37.1 C) (08/22 1744) Pulse Rate:  [110-173] 126  (08/22 1744) Resp:  [16-24] 20  (08/22 1744) BP: (112-151)/(66-104) 128/89 mmHg (08/22 1744) SpO2:  [96 %-100 %] 97 % (08/22 1744)  Physical Exam:  General: alert, cooperative and no distress Breasts:  Heart: RRR Lungs: CTAB Abdomen: BS x4 Uterine Fundus: firm Incision: healing well, no significant drainage, no significant erythema , to shower later today and take down wound pad.  Lochia: appropriate DVT Evaluation: No evidence of DVT seen on physical exam. Negative Homan's sign. Calf/Ankle edema is present.   Basename 01/17/12 0520 01/16/12 0640  HGB 10.3* 12.3  HCT 30.0* 37.0   Mild postpartum anemia  Assessment/Plan: Status post Cesarean section. Doing well postoperatively.  Continue current care.  Earl Gala, CNM. 01/17/2012, 6:49 PM

## 2012-01-17 NOTE — Anesthesia Postprocedure Evaluation (Signed)
Anesthesia Post Note  Patient: Kelly Chan  Procedure(s) Performed: Procedure(s) (LRB): CESAREAN SECTION (N/A)  Anesthesia type: Epidural  Patient location: PACU  Post pain: Pain level controlled  Post assessment: Post-op Vital signs reviewed  Last Vitals:  Filed Vitals:   01/17/12 0145  BP: 139/86  Pulse: 142  Temp:   Resp: 21    Post vital signs: Reviewed  Level of consciousness: awake  Complications: No apparent anesthesia complications

## 2012-01-17 NOTE — Progress Notes (Signed)
Comfortable, agrees to C/S O VS T 99 rectal had tylenol at 9pm for headache      fhts 160 LTV min late decels      abd soft between uc q 2-3      Contractions not adequate      Vag 3 100 -1 no fluid seen small amount bloody show A NR FHR P pitocin off, resposition, no brethine due to MP 120 now, risks of cesarean section bleeding, infection discussed, Dr. Estanislado Pandy updated et will notify surgery. Lavera Guise, CNM

## 2012-01-17 NOTE — Op Note (Signed)
Preoperative diagnosis: Intrauterine pregnancy at 39 weeks and 6 days with [facility]  Post operative diagnosis: Same  Anesthesia: Epidural  Anesthesiologist: Dr. Arby Barrette  Procedure: Primary low transverse cesarean section  Surgeon: Dr. Dois Davenport Blayton Huttner  Assistant: Lavera Guise CNM  Estimated blood loss: 600 cc  Procedure:  After being informed of the planned procedure and possible complications including bleeding, infection, injury to other organs, informed consent is obtained. The patient is taken to OR #1 and given spinal anesthesia without complication. She is placed in the dorsal decubitus position with the pelvis tilted to the left. She is then prepped and draped in a sterile fashion. A Foley catheter is inserted in her bladder.  After assessing adequate level of anesthesia, we infiltrate the suprapubic area with 20 cc of Marcaine 0.25 and perform a Pfannenstiel incision which is brought down sharply to the fascia. The fascia is entered in a low transverse fashion. Linea alba is dissected. Peritoneum is entered in a midline fashion. An Alexis retractor is easily positioned.   The myometrium is then entered in a low transverse fashion, 2 cm above the vesico-uterine junction ; first with knife and then extended bluntly. Amniotic fluid is clear. We assist the birth of a female  infant in vertex OP presentation. Mouth and nose are suctioned. The baby is delivered. The cord is clamped and sectioned. The baby is given to the neonatologist present in the room.  10 cc of blood is drawn from the umbilical vein.The placenta is allowed to deliver spontaneously. It is complete and the cord has 3 vessels. Uterine revision is negative.  We proceed with closure of the myometrium in 2 layers: First with a running locked suture of 0 Vicryl, then with a Lembert suture of 0 Vicryl imbricating the first one. Hemostasis is completed with cauterization on peritoneal edges.  Both paracolic gutters are cleaned.  Both tubes and ovaries are assessed and normal. The pelvis is profusely irrigated with warm saline to confirm a satisfactory hemostasis.  Retractors and sponges are removed. Under fascia hemostasis is completed with cauterization. The fascia is then closed with 2 running sutures of 0 Vicryl meeting midline. The wound is irrigated with warm saline and hemostasis is completed with cauterization. The skin is closed with a subcuticular suture of 3-0 Monocryl and Steri-Strips.  Instrument and sponge count is complete x2. Estimated blood loss is 600 cc.  The procedure is well tolerated by the patient who is taken to recovery room in a well and stable condition.  female baby named [name] was born at 00:44 and received an Apgar of 9  at 1 minute and 9 at 5 minutes.    Specimen: Placenta sent to [facility]   Torsha Lemus A MD 8/22/20131:14 AM

## 2012-01-18 ENCOUNTER — Encounter (HOSPITAL_COMMUNITY): Payer: Self-pay | Admitting: Obstetrics and Gynecology

## 2012-01-18 NOTE — Discharge Summary (Signed)
Obstetric Discharge Summary Reason for Admission: induction of labor due to gestational hypertension Prenatal Procedures: NST and ultrasound Intrapartum Procedures: cesarean: low cervical, transverse due to NRFHR, 24 hour urine for protein (negative) Postpartum Procedures: none Complications-Operative and Postpartum: Mild persistent elevation of BP after delivery Hemoglobin  Date Value Range Status  01/17/2012 10.3* 12.0 - 15.0 g/dL Final     HCT  Date Value Range Status  01/17/2012 30.0* 36.0 - 46.0 % Final   Hospital Course: Admitted on 01/16/12 with gestational hypertension for induction from MAU visit.  Negative GBS. Pitocin initiated, with epidural placed per patient request.  24 hour urine was initiated during labor and continued postdelivery, with result of 176 mg protein/24 hours and normal PIH labs.  Patient progressed to 3 cm, with AROM attempted, but no fluid obtained.  She began to have decelerations of the FHR, and a primary C/S was performed by Dr. Estanislado Pandy without complication. Patient and baby tolerated the procedure without difficulty. Infant status was stable and remained in OR with mother.  She still had some mild elevations of her BP during her postpartum course, with a plan made for Aldomet 250 mg po BID upon discharge.  Mother and infant then had an uncomplicated postpartum course, with patient doing both breast feeding and bottle feeding. Mom's physical exam was WNL, and she was discharged home on day 2, at her request, in stable condition. Contraception plan was undecided at the time of discharge--patient will research methods and decide by 6 weeks.  She received adequate benefit from po pain medications and was sent home with Percocet and Ibuprophen, and Aldomet 250 mg po BID.  [facility] nurse will see the patient on 8/26 or 8/27 for BP check.  Filed Vitals:   01/18/12 1428 01/18/12 2119 01/19/12 0538 01/19/12 0721  BP: 135/95 128/95  144/91  Pulse: 112 106  106  Temp: 98  F (36.7 C) 98.3 F (36.8 C) 98.4 F (36.9 C) 97.5 F (36.4 C)  TempSrc: Oral Oral Oral Oral  Resp: 20 20 20 20   Height:      Weight:      SpO2:       Weight 220 on 8/21. Will weigh before D/C for documentation of status.    Physical Exam:  General: alert Lochia: appropriate Uterine Fundus: firm Incision: healing well DVT Evaluation: No evidence of DVT seen on physical exam. Negative Homan's sign. 1+ edema, DTR 1+ without clonus  Discharge Diagnoses: Term Pregnancy-delivered and primary cesarean delivery due to FTP  Discharge Information: Date: 01/19/2012 Activity: Per [facility] handout Diet: routine Medications: Ibuprofen and Percocet, Aldomet  Condition: stable Instructions: refer to practice specific booklet Discharge to: home Contraception: Follow-up Information    Follow up with [facility] in 6 weeks. (Call to schedule circumcision, and schedule 6 week postpartum visit at [facility])          Newborn Data: Live born female  Birth Weight: 7 lb 8.5 oz (3415 g) APGAR: 9, 9  Home with mother.  Nigel Bridgeman 01/19/2012, 8:50 AM

## 2012-01-18 NOTE — Progress Notes (Signed)
Subjective: Postpartum Day 1 Cesarean Delivery Patient reports tolerating PO, + flatus and no problems voiding comfortable with motrin and percocet.    Objective:  Hemoglobin & Hematocrit     Component Value Date/Time   HGB 10.3* 01/17/2012 0520   HCT 30.0* 01/17/2012 0520      Vital signs in last 24 hours: BP 135/95  Pulse 112  Temp 98 F (36.7 C) (Oral)  Resp 20  Ht 5\' 5"  (1.651 m)  Wt 220 lb (99.791 kg)  BMI 36.61 kg/m2  SpO2 96%  LMP 04/08/2011  Breastfeeding? Unknown   Physical Exam:  General: alert, cooperative and no distress Lochia: appropriate Uterine Fundus: firm Incision:  DVT Evaluation: Negative Homan's sign. Calf/Ankle edema is present.+2 nonpitting DTRS +1 bilaterally no clonus Lungs clear bilaterally AP RRR Bowel sounds hypoactive abd, typanic S: comfortable, little bleeding, slept     breastfeeding A normal involution     Lactating     PO day 1     Normal involution     Gestational hypertension P encouraged ambulation continue care Lavera Guise, CNM

## 2012-01-18 NOTE — Clinical Social Work Maternal (Signed)
    Clinical Social Work Department PSYCHOSOCIAL ASSESSMENT - MATERNAL/CHILD 01/18/2012  Patient:  Kelly Chan, Kelly Chan  Account Number:  [id number]  Admit Date:  01/16/2012  Marjo Bicker Name:   [name] A. Veronica    Clinical Social Worker:  Nobie Putnam, Theresia Majors   Date/Time:  01/18/2012 11:44 AM  Date Referred:  01/18/2012   Referral source  CN     Referred reason  Substance Abuse   Other referral source:    I:  FAMILY / HOME ENVIRONMENT Child's legal guardian:  PARENT  Guardian - Name Guardian - Age Guardian - Address  Kelly Chan [address]; [place], [place] [address]  Quincy Carnes     Other household support members/support persons Name Relationship DOB  Blondine Graham GRAND MOTHER    Other support:    II  PSYCHOSOCIAL DATA Information Source:  Patient Interview  Event organiser Employment:   Surveyor, quantity resources:  Media planner If [employer] - [place]:  [place] Other  Sales executive  WIC   School / Grade:   Maternity Care Coordinator / Child Services Coordination / Early Interventions:  Cultural issues impacting care:    III  STRENGTHS Strengths  Adequate Resources  Home prepared for Child (including basic supplies)  Supportive family/friends   Strength comment:    IV  RISK FACTORS AND CURRENT PROBLEMS Current Problem:  None   Risk Factor & Current Problem Patient Issue Family Issue Risk Factor / Current Problem Comment  Substance Abuse Y N Hx of MJ use    V  SOCIAL WORK ASSESSMENT Pt admits to smoking MJ, "twice a week," prior to pregnancy confirmation at 3 weeks.  Once pregnancy was confirmed, she continued to smoke until 2 months of pregnancy.  She explained that the MJ smoke helped with nausea.  She denies other illegal substance use.  Hospital drug testing policy was explained.  UDS is negative, meconium results are pending.  She denies any history of depression or Si.  She has all the necessary supplies for the infant.  Sw will  continue to monitor drug screen results and make a referral if needed.  Sw available to assist further if needed.      VI SOCIAL WORK PLAN Social Work Plan  No Further Intervention Required / No Barriers to Discharge   Type of pt/family education:   If child protective services report - county:   If child protective services report - date:   Information/referral to community resources comment:   Other social work plan:

## 2012-01-19 DIAGNOSIS — Z98891 History of uterine scar from previous surgery: Secondary | ICD-10-CM | POA: Diagnosis not present

## 2012-01-19 MED ORDER — IBUPROFEN 600 MG PO TABS: 600.0000 mg | ORAL_TABLET | Freq: Four times a day (QID) | ORAL | Status: AC | PRN

## 2012-01-19 MED ORDER — METHYLDOPA 250 MG PO TABS: 250.0000 mg | ORAL_TABLET | Freq: Two times a day (BID) | ORAL | Status: DC

## 2012-01-19 MED ORDER — OXYCODONE-ACETAMINOPHEN 5-325 MG PO TABS: 1.0000 | ORAL_TABLET | ORAL | Status: AC | PRN

## 2012-01-19 MED ORDER — METHYLDOPA 250 MG PO TABS
250.0000 mg | ORAL_TABLET | Freq: Two times a day (BID) | ORAL | Status: DC
  Administered 2012-01-19: 250 mg via ORAL
  Filled 2012-01-19 (×4): qty 1

## 2012-01-21 ENCOUNTER — Telehealth: Payer: Self-pay | Admitting: Obstetrics and Gynecology

## 2012-01-21 NOTE — Telephone Encounter (Signed)
Message copied by Delon Sacramento on Mon Jan 21, 2012  9:14 AM ------      Message from: Cornelius Moras      Created: Sun Jan 20, 2012  6:36 PM      Regarding: [facility] Nurse visit for pt       This patient needs [employer] RN visit early this week--she was started on Aldomet 250 mg po BID at D/C on Saturday, 01/19/12.      Needs a BP check early this week.            VL

## 2012-01-21 NOTE — Telephone Encounter (Signed)
Tc to the [facility] Supervisor to set up appt per below, Lm on vm to call back.

## 2012-01-23 ENCOUNTER — Telehealth: Payer: Self-pay | Admitting: Obstetrics and Gynecology

## 2012-01-23 NOTE — Telephone Encounter (Signed)
Consult with [facility] CNM reported BP, per DEE pt to increase aldomet to 500mg   BID and have Shawnda RN recheck bp in one week.call to pt with this information may cause ha 1st increased dose also to not drive or go out for 2-3 hours after dose so she can monitor her reaction to new doseage pt understands and is agreeable.Notified Franchot Erichsen RN to recheck bp in one week DFaulconer RN

## 2012-01-23 NOTE — Telephone Encounter (Signed)
Ruffin Frederick RN nurse from [facility] calling in pt's BP del 01-17-12 on aldomet 250mg  BID no ha,no vision changes she does have some lower leg swelling her BP today is 130/90.will notify CNM and call back with rec DFaulconer RN

## 2012-01-27 ENCOUNTER — Telehealth: Payer: Self-pay | Admitting: Obstetrics and Gynecology

## 2012-01-27 NOTE — Telephone Encounter (Signed)
TC from pt to report increased vag bleeding, had primary c/s on 8-22 for FTP. Pt states her bleeding was light, but started getting heavier yesterday and today, states she changed about 3 pads today, and they were not completely saturated, also reports passing a few small clots this evening. Has mild incisional pain, only take motrin PRN. Pt states she has been at the hospital ([facility]) w baby for the last few days, infant was stable at dc and went home w mom on day 2, per pt states baby had stopped breathing about 5 days after delivery so they admitted him here to NICU and now he is stable at [facility]. She states her grandmother is her support person but she works, but does come to the hospital in the evening. FOB is on house arrest. Pt also on aldomet for GHTN, smart start nurse was to come check BP on Wednesday but pt states she will not be home since she is staying w the baby at the hospital. Rv'd bleeding precautions and instructed pt to try to rest as much as possible. She denies any syncope. Instructed pt to call office and schedule a nurse visit (only) for a BP check, this week. Pt agreed w this plan and will call if worsening symptoms. Support given to pt.

## 2012-01-29 ENCOUNTER — Telehealth: Payer: Self-pay | Admitting: Obstetrics and Gynecology

## 2012-01-29 NOTE — Telephone Encounter (Signed)
Message copied by Delon Sacramento on Tue Jan 29, 2012  4:49 PM ------      Message from: Malissa Hippo.      Created: Sun Jan 27, 2012 10:10 PM      Regarding: Post C/S needs to come for BP check only        See my telephone encounter note.       Pt had primary c/s on 8-22, and is on aldomet.       Smart start nurse was going to come check BP on Wednesday but pt is at the hospital w baby.      I told her to call and scheduled a nurse visit to check her BP this week.       Please call on call provider w results.             Thanks!      SL

## 2012-01-29 NOTE — Telephone Encounter (Signed)
Message copied by Delon Sacramento on Tue Jan 29, 2012  4:55 PM ------      Message from: Malissa Hippo.      Created: Sun Jan 27, 2012 10:10 PM      Regarding: Post C/S needs to come for BP check only        See my telephone encounter note.       Pt had primary c/s on 8-22, and is on aldomet.       Smart start nurse was going to come check BP on Wednesday but pt is at the hospital w baby.      I told her to call and scheduled a nurse visit to check her BP this week.       Please call on call provider w results.             Thanks!      SL

## 2012-01-29 NOTE — Telephone Encounter (Signed)
Tc to pt per msg below, lm on vm to call back.

## 2012-01-29 NOTE — Telephone Encounter (Signed)
Pt returned call, informed per SL need a BP check, pt sched for BP check in the lab tomorrow @ 1145.

## 2012-01-30 ENCOUNTER — Other Ambulatory Visit: Payer: BC Managed Care – PPO

## 2012-02-28 ENCOUNTER — Ambulatory Visit (INDEPENDENT_AMBULATORY_CARE_PROVIDER_SITE_OTHER): Payer: BC Managed Care – PPO | Admitting: Obstetrics and Gynecology

## 2012-02-28 ENCOUNTER — Encounter: Payer: Self-pay | Admitting: Obstetrics and Gynecology

## 2012-02-28 VITALS — BP 112/64 | Ht 66.5 in | Wt 193.0 lb

## 2012-02-28 DIAGNOSIS — IMO0001 Reserved for inherently not codable concepts without codable children: Secondary | ICD-10-CM

## 2012-02-28 DIAGNOSIS — Z309 Encounter for contraceptive management, unspecified: Secondary | ICD-10-CM

## 2012-02-28 MED ORDER — MEDROXYPROGESTERONE ACETATE 150 MG/ML IM SUSP: 150.0000 mg | INTRAMUSCULAR | Status: DC

## 2012-02-28 NOTE — Progress Notes (Signed)
HISTORY OF PRESENT ILLNESS  [name] is a 21 y.o. year old female,G1P1001, who presents for a problem visit. The patient had a cesarean section 6 weeks ago for fetal intolerance of labor.  Subjective:  Doing well.  No GI or GU problems.  No postpartum depression.  Objective:  BP 112/64  Ht 5' 6.5" (1.689 m)  Wt 193 lb (87.544 kg)  BMI 30.68 kg/m2  LMP 02/25/2012  Breastfeeding? No   GI: incision: clean, dry, intact and nontender and no masses  External genitalia: normal general appearance Vaginal: normal without tenderness, induration or masses Cervix: normal appearance Adnexa: normal bimanual exam Uterus: nontender, normal size  Assessment:  Doing well following cesarean section  Plan:  Depo-Provera for contraception Return to normal activities  Return to office in 6 month(s).   Leonard Schwartz M.D.  02/28/2012 11:37 AM    Date of delivery: 01/17/2012 Female Name: Kelly Chan Vaginal delivery:no Cesarean section:yes Tubal ligation:no GDM:no Breast Feeding:no Bottle Feeding:yes Post-Partum Blues:no Abnormal pap:no Normal GU function: yes Normal GI function:yes Returning to work:no EPDS: 2

## 2012-03-03 ENCOUNTER — Telehealth: Payer: Self-pay

## 2012-03-03 NOTE — Telephone Encounter (Signed)
TC TO PT REGARDING MESSAGE. PT WANT A RX FOR DIFLUCAN AND I INFORMED PT THAT SINCE SHE WAS NOT EVALUATED AT THE LAST VISIT FOR VAGINAL DISCHARGE,WE NEED TO SCHEDULE AN APPT TO EVAL THE DISCHARGE AND ITCHING WHICH THAT IS THE SX PT IS HAVING. SCHEDULE PT AN APPT AND PT VOICED UNDERSTANDING.

## 2012-03-03 NOTE — Telephone Encounter (Signed)
TC from pt wants Diflucan pill rf for a yeast infection.

## 2012-03-04 ENCOUNTER — Ambulatory Visit (INDEPENDENT_AMBULATORY_CARE_PROVIDER_SITE_OTHER): Payer: BC Managed Care – PPO | Admitting: Obstetrics and Gynecology

## 2012-03-04 ENCOUNTER — Encounter: Payer: Self-pay | Admitting: Obstetrics and Gynecology

## 2012-03-04 VITALS — BP 100/62 | Resp 14

## 2012-03-04 DIAGNOSIS — N898 Other specified noninflammatory disorders of vagina: Secondary | ICD-10-CM

## 2012-03-04 DIAGNOSIS — A599 Trichomoniasis, unspecified: Secondary | ICD-10-CM

## 2012-03-04 LAB — POCT WET PREP (WET MOUNT): Whiff Test: NEGATIVE

## 2012-03-04 MED ORDER — METRONIDAZOLE 500 MG PO TABS
500.0000 mg | ORAL_TABLET | Freq: Two times a day (BID) | ORAL | Status: DC
Start: 2012-03-04 — End: 2012-04-25

## 2012-03-04 NOTE — Addendum Note (Signed)
Addended by: Tim Lair on: 03/04/2012 04:57 PM   Modules accepted: Orders

## 2012-03-04 NOTE — Progress Notes (Signed)
HISTORY OF PRESENT ILLNESS  [name] is a 21 y.o. year old female,G1P1001, who presents for a problem visit. She has not been sex active since her cesarean section.  Subjective:  The patient complains of a vaginal discharge with itching.  Her bleeding has resolved.  Objective:  BP 100/62  Resp 14  LMP 02/25/2012  Breastfeeding? No   GI: soft and nontender, incision well healed  External genitalia: normal general appearance Vaginal: normal without tenderness, induration or masses and plan discharge present Cervix: normal appearance Adnexa: normal bimanual exam Uterus: normal size, nontender  Wet prep: Trichomoniasis  Assessment:  Trichomoniasis  Vaginal discharge  Rule out sexually transmitted infections  Plan:  Metronidazole 500 mg twice each day for 7 days.  Gonorrhea and Chlamydia sent.  Partner to be evaluated and treated.  Return to office in 4 week(s).   Leonard Schwartz M.D.  03/04/2012 4:45 PM    Odor: no Fever: no Pelvic Pain: no  Itching: yes Dyspareunia: no Desires GC/CT: no  Thin: no History of PID: no Desires HIV,RPR,HbsAG: no  Thick: yes History of STD: yes Other: pt states discharge started after she stopped bleeding she is 7weeks post partum. She believes it may be from the pads she wears.

## 2012-04-01 ENCOUNTER — Encounter: Payer: BC Managed Care – PPO | Admitting: Obstetrics and Gynecology

## 2012-04-20 ENCOUNTER — Encounter (HOSPITAL_COMMUNITY): Payer: Self-pay | Admitting: *Deleted

## 2012-04-20 ENCOUNTER — Inpatient Hospital Stay (HOSPITAL_COMMUNITY)
Admission: AD | Admit: 2012-04-20 | Discharge: 2012-04-20 | Disposition: A | Discharge status: Home / Self Care | Payer: BC Managed Care – PPO | Source: Ambulatory Visit | Attending: Obstetrics & Gynecology | Admitting: Obstetrics & Gynecology

## 2012-04-20 DIAGNOSIS — N938 Other specified abnormal uterine and vaginal bleeding: Secondary | ICD-10-CM | POA: Insufficient documentation

## 2012-04-20 DIAGNOSIS — R599 Enlarged lymph nodes, unspecified: Secondary | ICD-10-CM | POA: Insufficient documentation

## 2012-04-20 DIAGNOSIS — N750 Cyst of Bartholin's gland: Secondary | ICD-10-CM

## 2012-04-20 DIAGNOSIS — N949 Unspecified condition associated with female genital organs and menstrual cycle: Secondary | ICD-10-CM | POA: Insufficient documentation

## 2012-04-20 HISTORY — DX: Gestational (pregnancy-induced) hypertension without significant proteinuria, unspecified trimester: O13.9

## 2012-04-20 LAB — URINE MICROSCOPIC-ADD ON

## 2012-04-20 LAB — URINALYSIS, ROUTINE W REFLEX MICROSCOPIC
Glucose, UA: NEGATIVE mg/dL
pH: 6 (ref 5.0–8.0)

## 2012-04-20 NOTE — MAU Provider Note (Signed)
History     CSN: [id number]  Arrival date and time: 04/20/12 8295  Seen by provider at 0725.     Chief Complaint  Patient presents with  . Vaginal Pain   HPI Kelly Chan 21 y.o. Client comes to MAU today with pain in vagina and vaginal bleeding.  Had Depo in October and has continued to have bleeding.  Has had a "cyst" on her vagina before which usually goes away.  Today is more painful. Came for evaluation.  OB History    Grav Para Term Preterm Abortions TAB SAB Ect Mult Living   1 1 1  0 0 0 0 0 0 1      Past Medical History  Diagnosis Date  . Gonorrhea   . BV (bacterial vaginosis)   . Abnormal Pap smear 06/2011  . H/O candidiasis   . History of bacterial infection   . Yeast infection   . Pregnancy induced hypertension     Past Surgical History  Procedure Date  . Wisdom tooth extraction   . No past surgeries   . Cesarean section 01/17/2012    Procedure: CESAREAN SECTION;  Surgeon: Esmeralda Arthur, MD;  Location: [facility];  Service: Obstetrics;  Laterality: N/A;  Priimary Cesarean Section Delivery Boy @0044 , Apgars 9/9    Family History  Problem Relation Age of Onset  . Cancer Maternal Aunt     Breast   . Cancer Maternal Aunt     Breast  . Hypertension Maternal Grandmother   . Other Neg Hx     History  Substance Use Topics  . Smoking status: Former Smoker -- 0.2 packs/day    Quit date: 05/25/2011  . Smokeless tobacco: Never Used     Comment: "I've quit because I can't tolerate the smell anymore."  . Alcohol Use: No    Allergies: No Known Allergies  Prescriptions prior to admission  Medication Sig Dispense Refill  . calcium carbonate (TUMS - DOSED IN MG ELEMENTAL CALCIUM) 500 MG chewable tablet Chew 2 tablets by mouth 3 (three) times daily as needed. For heartburn      . medroxyPROGESTERone (DEPO-PROVERA) 150 MG/ML injection Inject 1 mL (150 mg total) into the muscle every 3 (three) months.  1 mL  2  . methyldopa (ALDOMET) 250 MG tablet Take 1  tablet (250 mg total) by mouth 2 (two) times daily.  60 tablet  2  . metroNIDAZOLE (FLAGYL) 500 MG tablet Take 1 tablet (500 mg total) by mouth 2 (two) times daily with a meal.  14 tablet  0  . Multiple Vitamin (MULTIVITAMIN WITH MINERALS) TABS Take 1 tablet by mouth daily.        Review of Systems  Constitutional: Negative for fever.  Gastrointestinal: Negative for nausea, vomiting and abdominal pain.  Genitourinary: Negative for dysuria.       Vaginal bleeding  Vaginal pain   Physical Exam   Blood pressure 127/91, pulse 102, temperature 98.3 F (36.8 C), temperature source Oral, resp. rate 16, height 5\' 6"  (1.676 m), weight 85.004 kg (187 lb 6.4 oz), unknown if currently breastfeeding.  Physical Exam  Nursing note and vitals reviewed. Constitutional: She is oriented to person, place, and time. She appears well-developed and well-nourished.  HENT:  Head: Normocephalic.  Eyes: EOM are normal.  Neck: Neck supple.  GI: Soft. There is no tenderness.  Genitourinary:       Speculum exam: Vulva - left labia on inner side is swollen in the area of the Bartholin's  gland 3cmX2cm in size.  Can easily palpate.  Firm to touch.  Tender only in a small area extending toward the labia - very firm in that area as well. Vagina - Small amount of blood noted, no odor Cervix - No active bleeding Bimanual exam: deferred GC/Chlam, wet prep done Chaperone present for exam.  Musculoskeletal: Normal range of motion.  Neurological: She is alert and oriented to person, place, and time.  Skin: Skin is warm and dry.  Psychiatric: She has a normal mood and affect.    MAU Course  Procedures Results for orders placed during the hospital encounter of 04/20/12 (from the past 24 hour(s))  URINALYSIS, ROUTINE W REFLEX MICROSCOPIC     Status: Abnormal   Collection Time   04/20/12  6:45 AM      Component Value Range   Color, Urine YELLOW  YELLOW   APPearance CLEAR  CLEAR   Specific Gravity, Urine 1.025   1.005 - 1.030   pH 6.0  5.0 - 8.0   Glucose, UA NEGATIVE  NEGATIVE mg/dL   Hgb urine dipstick TRACE (*) NEGATIVE   Bilirubin Urine NEGATIVE  NEGATIVE   Ketones, ur NEGATIVE  NEGATIVE mg/dL   Protein, ur NEGATIVE  NEGATIVE mg/dL   Urobilinogen, UA 0.2  0.0 - 1.0 mg/dL   Nitrite NEGATIVE  NEGATIVE   Leukocytes, UA TRACE (*) NEGATIVE  URINE MICROSCOPIC-ADD ON     Status: Normal   Collection Time   04/20/12  6:45 AM      Component Value Range   Squamous Epithelial / LPF RARE  RARE   WBC, UA 0-2  <3 WBC/hpf   RBC / HPF 0-2  <3 RBC/hpf   Bacteria, UA RARE  RARE  WET PREP, GENITAL     Status: Abnormal   Collection Time   04/20/12  7:45 AM      Component Value Range   Yeast Wet Prep HPF POC NONE SEEN  NONE SEEN   Trich, Wet Prep NONE SEEN  NONE SEEN   Clue Cells Wet Prep HPF POC NONE SEEN  NONE SEEN   WBC, Wet Prep HPF POC FEW (*) NONE SEEN    MDM Swollen left Bartholin's gland which has happened previously for her.  Has never been drained.  Has not used any warm soaks this week.  Area is firm to touch and only one small portion is tender.  Will have client use warm soaks to help heal the area.  Discussed possibility of needing to be incised and drained if becomes more tender or much larger.  Assessment and Plan  Swollen left Bartholin's gland Irregular vaginal bleeding - possibly from first Depo shot  Plan Warm soaks in a tub for 10-15 minutes 4x a day to help Bartholin's gland heal. Return if area becomes more tender and larger.   Kelly Chan 04/20/2012, 7:49 AM

## 2012-04-20 NOTE — MAU Note (Signed)
Pt reports vaginal knot on left inner lip x 1 wk.  Area is swollen and painful, no drainage.  Depot for birth control.

## 2012-04-21 LAB — GC/CHLAMYDIA PROBE AMP, GENITAL: Chlamydia, DNA Probe: NEGATIVE

## 2012-04-25 ENCOUNTER — Encounter (HOSPITAL_COMMUNITY): Payer: Self-pay

## 2012-04-25 ENCOUNTER — Inpatient Hospital Stay (HOSPITAL_COMMUNITY)
Admission: AD | Admit: 2012-04-25 | Discharge: 2012-04-25 | Disposition: A | Discharge status: Home / Self Care | Payer: BC Managed Care – PPO | Source: Ambulatory Visit | Attending: Family Medicine | Admitting: Family Medicine

## 2012-04-25 DIAGNOSIS — N751 Abscess of Bartholin's gland: Secondary | ICD-10-CM

## 2012-04-25 DIAGNOSIS — N750 Cyst of Bartholin's gland: Secondary | ICD-10-CM | POA: Insufficient documentation

## 2012-04-25 LAB — POCT PREGNANCY, URINE: Preg Test, Ur: NEGATIVE

## 2012-04-25 NOTE — MAU Provider Note (Signed)
Chart reviewed and agree with management and plan.  

## 2012-04-25 NOTE — MAU Note (Signed)
Pt states was here this past weekend, told to take hot baths 4x daily, has become worse, area is on inner left labia. Denies any other pain, no abnormal vaginal discharge

## 2012-04-25 NOTE — MAU Provider Note (Signed)
Bartholin Cyst I&D and Ward Catheter Placement Enlarged abscess palpated in front of the hymenal ring around 5 or 7 o' clock. On patient's left side.  Written informed consent was obtained.  Discussed complications and possible outcomes of procedure including recurrence of cyst, scarring leading to infecton, bleeding, dyspareunia, distortion of anatomy.  Patient was examined in the dorsal lithotomy position and mass was identified.  The area was prepped with Iodine and draped in a sterile manner. 1% Lidocaine (3 ml) was then used to infiltrate area on top of the cyst, behind the hymenal ring.  A 5 mm incision was made using a sterile scapel. Upon palpation of the mass, a moderate amount of bloody purulent drainage was expressed through the incision. A hemostat was used to break up loculations, which resulted in expression of more bloody purulent drainage.  A Word catheter was placed. 3 ml of sterile water was used to inflate the catheter balloon.  The end of the catheter was tucked into the vagina.  Patient tolerated the procedure well, reported feeling " a lot better." - Bactrim DS bid x 7 days for treatment - Recommended Sitz baths bid.  She was told to call to be examined if she experiences increasing swelling, pain, vaginal discharge, or fever.  - She was instructed to wear a peripad to absorb discharge, and to maintain pelvic rest while the Word catheter is in place.  -The catheter will be left in place for at least four to six weeks to promote formation of an epithelialized tract for permanent drainage of glandular secretions.  - She will need an appointment in GYN Clinicin 4-6 weeks from now for removal of word catheter.

## 2012-04-25 NOTE — MAU Provider Note (Signed)
History     CSN: [id number]  Arrival date and time: 04/25/12 0757   First Provider Initiated Contact with Patient 04/25/12 0827      Chief Complaint  Patient presents with  . Bartholin's Cyst   HPI  Kelly Chan is a 21 y.o. G1P1001 who presents today with a "cyst". She was seen on 11/24 and dx with Bartholin gland cyst. She has been doing warm soaks since then, and it has not gotten any better. It hurts to sit, stand and walk.   Past Medical History  Diagnosis Date  . Gonorrhea   . BV (bacterial vaginosis)   . Abnormal Pap smear 06/2011  . H/O candidiasis   . History of bacterial infection   . Yeast infection   . Pregnancy induced hypertension     Past Surgical History  Procedure Date  . Wisdom tooth extraction   . Cesarean section 01/17/2012    Procedure: CESAREAN SECTION;  Surgeon: Esmeralda Arthur, MD;  Location: [facility];  Service: Obstetrics;  Laterality: N/A;  Priimary Cesarean Section Delivery Boy @0044 , Apgars 9/9    Family History  Problem Relation Age of Onset  . Cancer Maternal Aunt     Breast   . Cancer Maternal Aunt     Breast  . Hypertension Maternal Grandmother   . Other Neg Hx     History  Substance Use Topics  . Smoking status: Former Smoker -- 0.2 packs/day    Quit date: 05/25/2011  . Smokeless tobacco: Never Used     Comment: "I've quit because I can't tolerate the smell anymore."  . Alcohol Use: No    Allergies: No Known Allergies  Prescriptions prior to admission  Medication Sig Dispense Refill  . calcium carbonate (TUMS - DOSED IN MG ELEMENTAL CALCIUM) 500 MG chewable tablet Chew 2 tablets by mouth 3 (three) times daily as needed. For heartburn      . medroxyPROGESTERone (DEPO-PROVERA) 150 MG/ML injection Inject 1 mL (150 mg total) into the muscle every 3 (three) months.  1 mL  2  . methyldopa (ALDOMET) 250 MG tablet Take 1 tablet (250 mg total) by mouth 2 (two) times daily.  60 tablet  2  . metroNIDAZOLE (FLAGYL) 500 MG tablet  Take 1 tablet (500 mg total) by mouth 2 (two) times daily with a meal.  14 tablet  0  . Multiple Vitamin (MULTIVITAMIN WITH MINERALS) TABS Take 1 tablet by mouth daily.        Review of Systems  Constitutional: Negative for fever and chills.  Gastrointestinal: Negative for nausea, vomiting, abdominal pain, diarrhea and constipation.  Genitourinary: Negative for dysuria, urgency and frequency.   Physical Exam   Blood pressure 105/69, temperature 97.6 F (36.4 C), temperature source Oral, resp. rate 16, height 5\' 6"  (1.676 m), not currently breastfeeding.  Physical Exam  Nursing note and vitals reviewed. Constitutional: She is oriented to person, place, and time. She appears well-developed and well-nourished.  Cardiovascular: Normal rate.   Respiratory: Effort normal.  GI: Soft.  Genitourinary:        2cmX2cm erythematous and fluctuant bartholin abscess.   Neurological: She is alert and oriented to person, place, and time.  Skin: Skin is warm and dry.  Psychiatric: She has a normal mood and affect.    MAU Course  Procedures  Dr. Shawnie Pons asked to examine patient and perform I&D. See procedure note.   Assessment and Plan   1. Bartholin's gland abscess   Incision and Drainage with  Ward catheter placement. FU with GYN clinic in 4-6 weeks Pelvic rest  Tawnya Crook 04/25/2012, 8:28 AM

## 2012-05-08 ENCOUNTER — Encounter: Payer: BC Managed Care – PPO | Admitting: Family Medicine

## 2012-05-12 IMAGING — US US OB TRANSVAGINAL
1 series · 14 of 28 positions shown · non-contrast
Comparison: none

[Series 1: us ob comp less 14 wks · 14 of 46 slices shown]
[im 2/46]
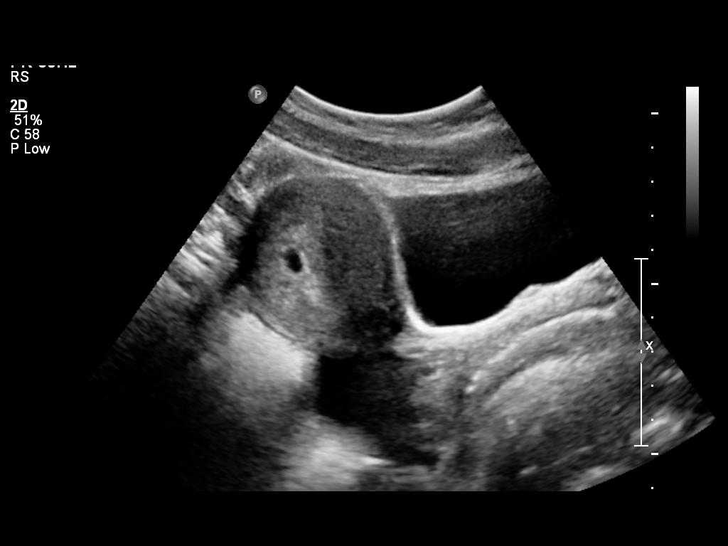
[im 6/46]
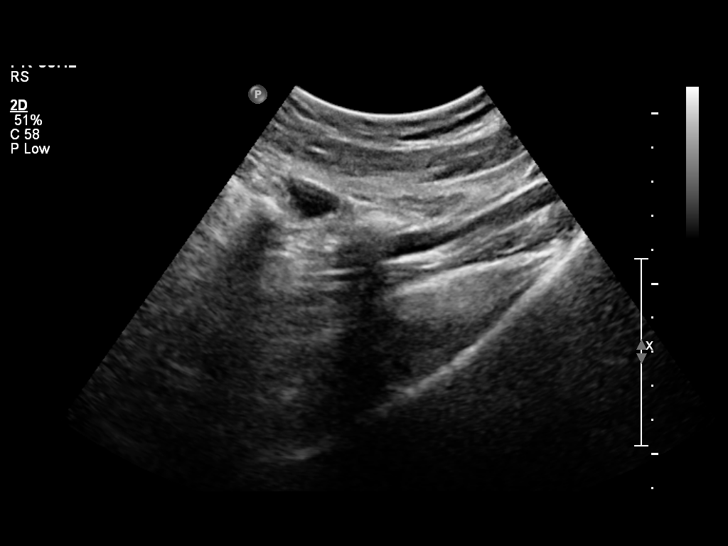
[im 9/46]
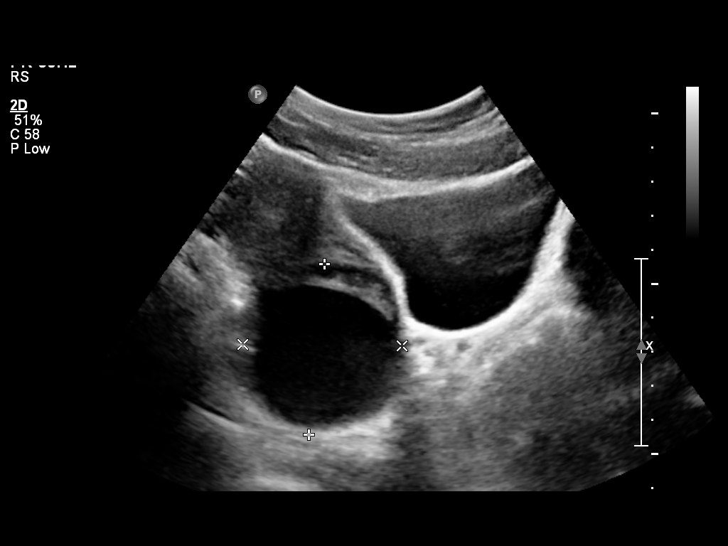
[im 12/46]
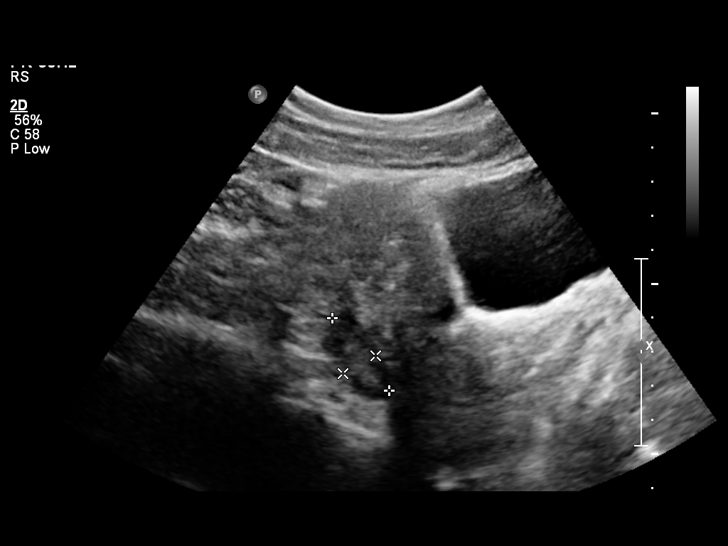
[im 16/46]
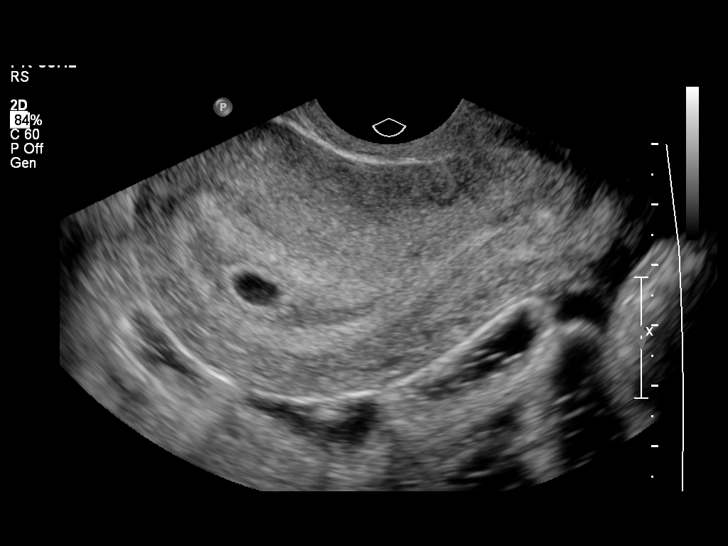
[im 19/46]
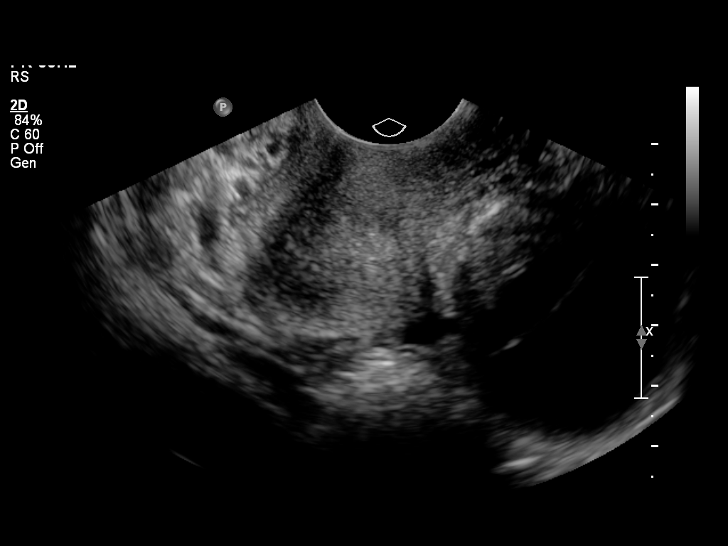
[im 22/46]
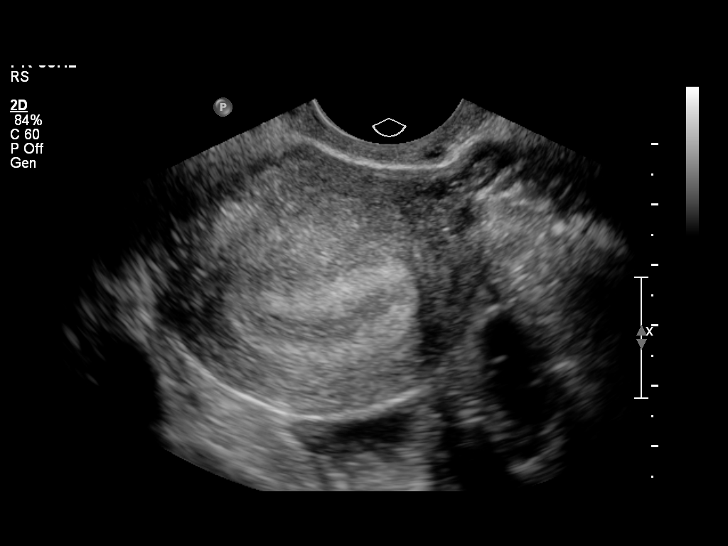
[im 26/46]
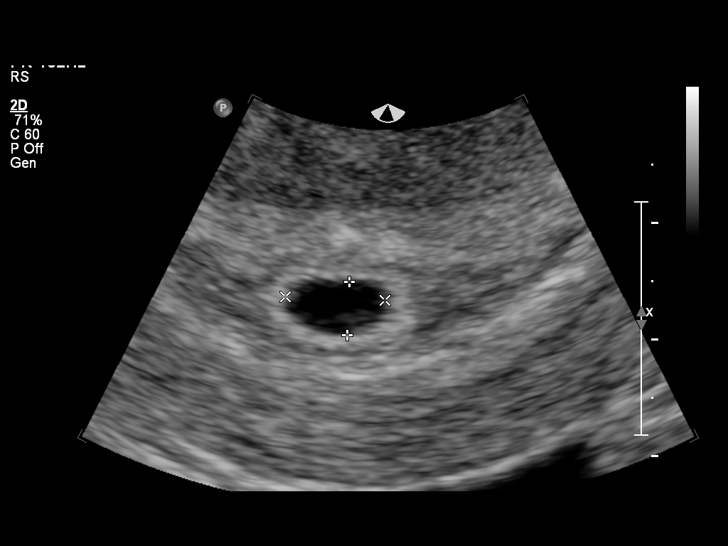
[im 29/46]
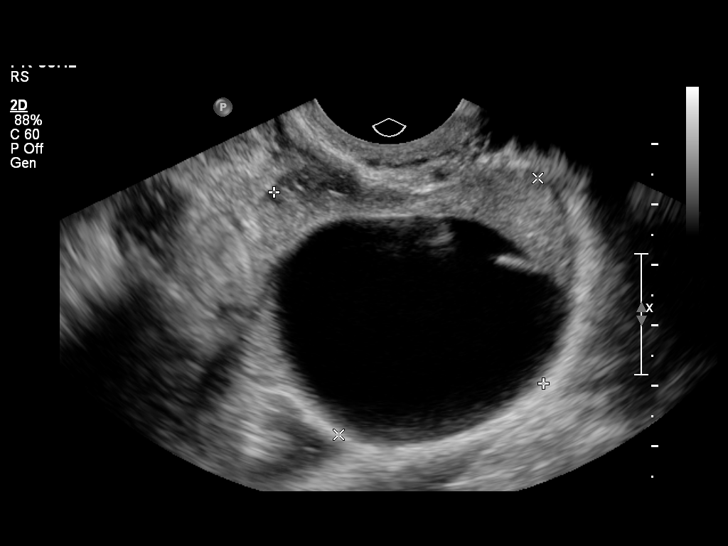
[im 32/46]
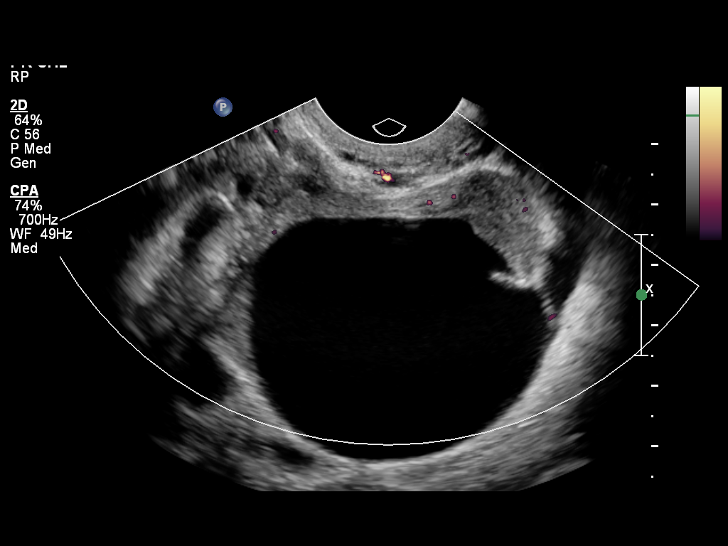
[im 36/46]
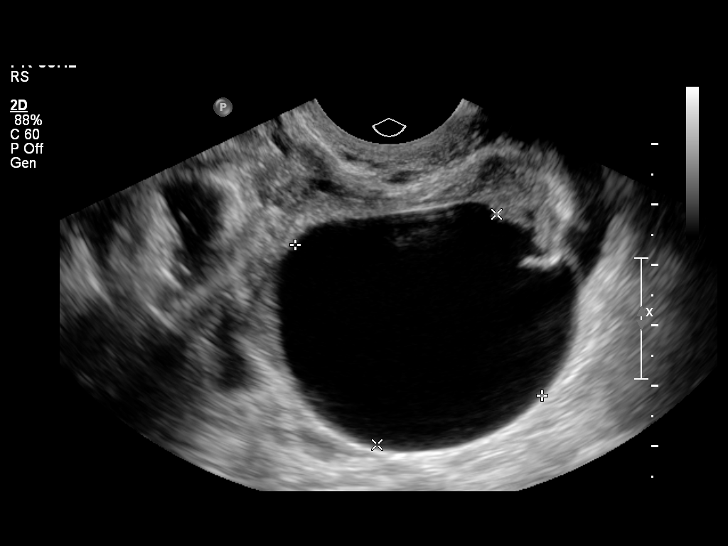
[im 39/46]
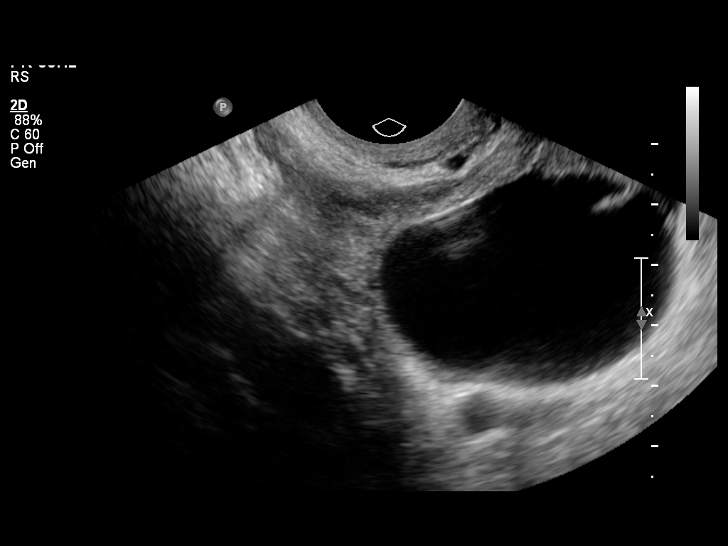
[im 42/46]
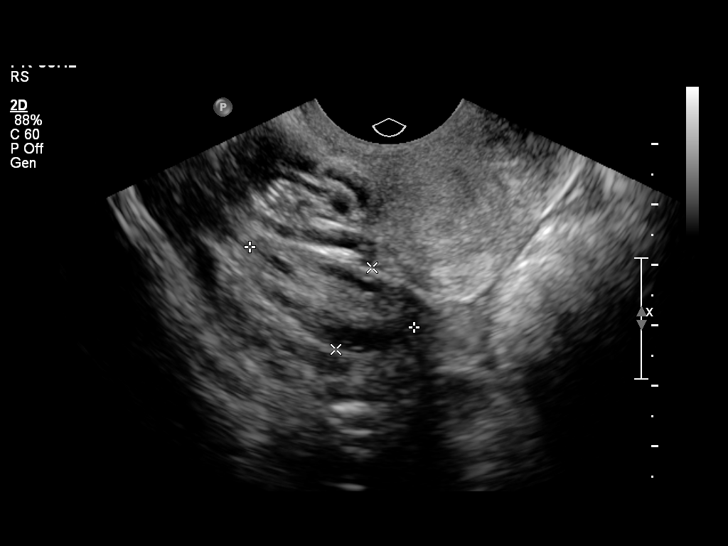
[im 46/46]
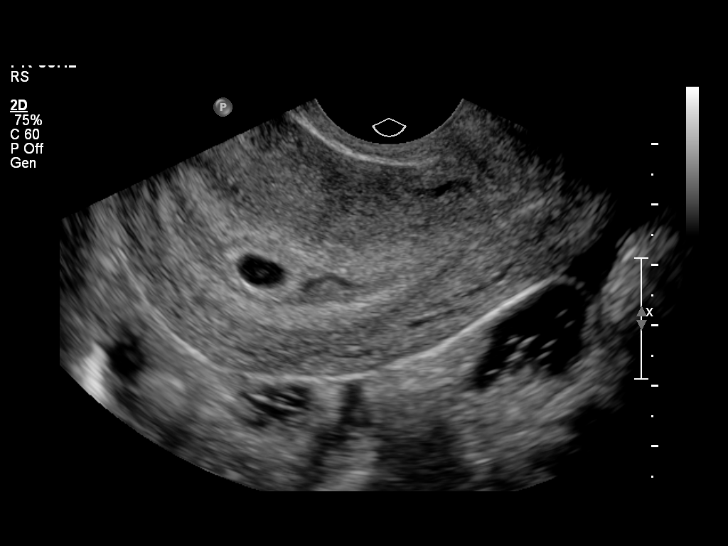

[14 of 28 positions shown; findings below may reference images not displayed]

OBSTETRICS REPORT
                      (Signed Final 05/12/2011 [DATE])

                 4_E
Procedures

 US OB COMP LESS 14 WKS                                76801.0
 US OB TRANSVAGINAL                                    76817.0
Indications

 Pain - Abdominal/Pelvic
Fetal Evaluation

 Gest. Sac:         Intrauterine
 Yolk Sac:          Visualized
 Fetal Pole:        Not visualized
 Cardiac Activity:  No embryo visualized
Biometry

 GS:        6.2 mm     G. Age:  5w 1d                   EDD:   01/11/12
Gestational Age

 LMP:           9w 2d         Date:  03/08/11                 EDD:   12/13/11
 Best:          5w 1d      Det. By:  U/S G S (05/12/11)       EDD:   01/11/12
Cervix Uterus Adnexa

 Cervix:       Closed.

 Left Ovary:    Simple cyst measuring 4.7x4.8x4.0cm
 Right Ovary:   Within normal limits.

 Adnexa:     No adnexal mass visualized.
Impression

 Intrauterine gestational sac is seen.  Yolk sac but no embryo
 seen. Follow up ultrasound in 10-14 days is recommended to
 document appropriate interval growth, presence of fetal pole,
 and for dating purposes.

## 2012-05-19 IMAGING — US US OB TRANSVAGINAL
1 series · 14 of 28 positions shown · non-contrast
Comparison: 05/12/2011.

CLINICAL DATA: Spotting.  Assess viability.

TRANSVAGINAL OB ULTRASOUND
TECHNIQUE: Transvaginal ultrasound was performed for evaluation of
the gestation as well as the maternal uterus and adnexal regions.

[Series 1: us ob transvaginal · 14 of 28 slices shown]
[im 2/28]
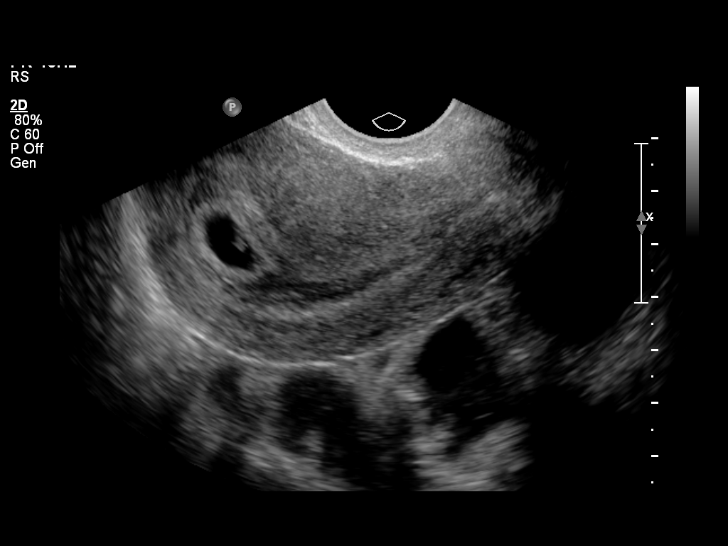
[im 4/28]
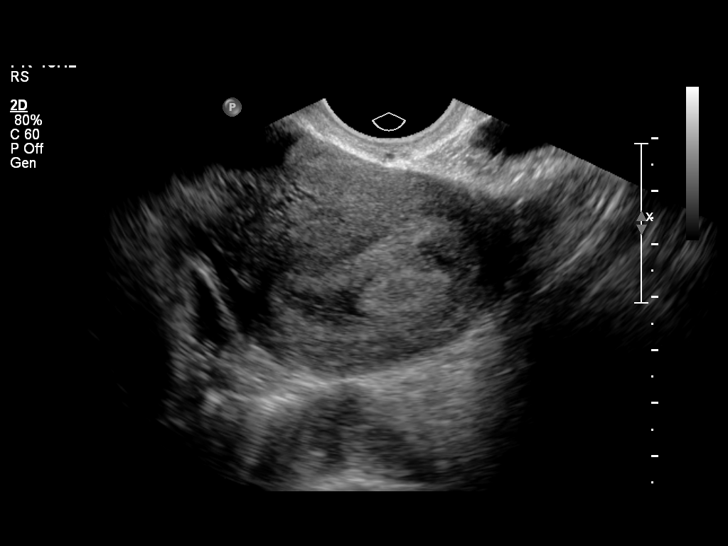
[im 6/28]
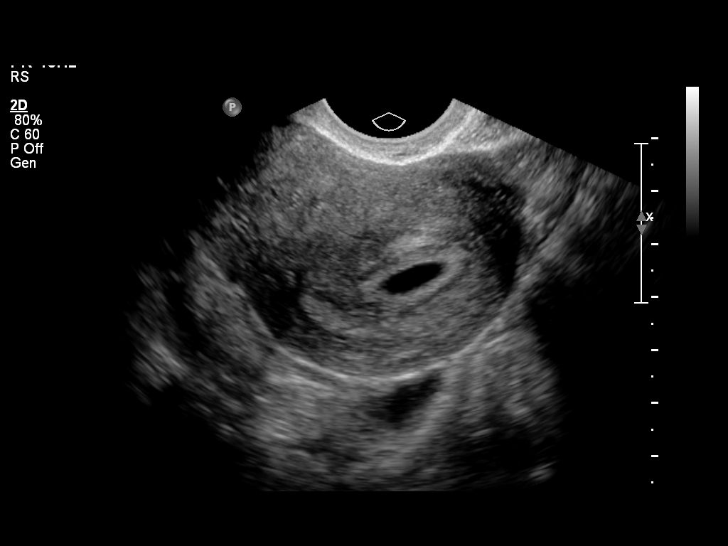
[im 8/28]
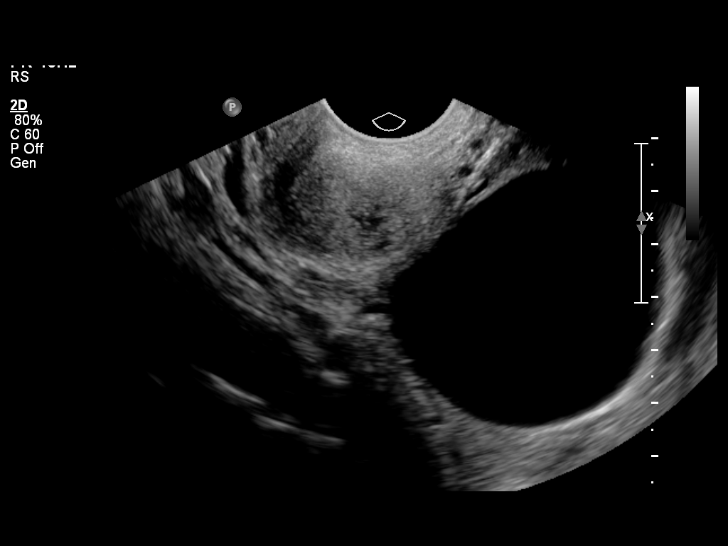
[im 10/28]
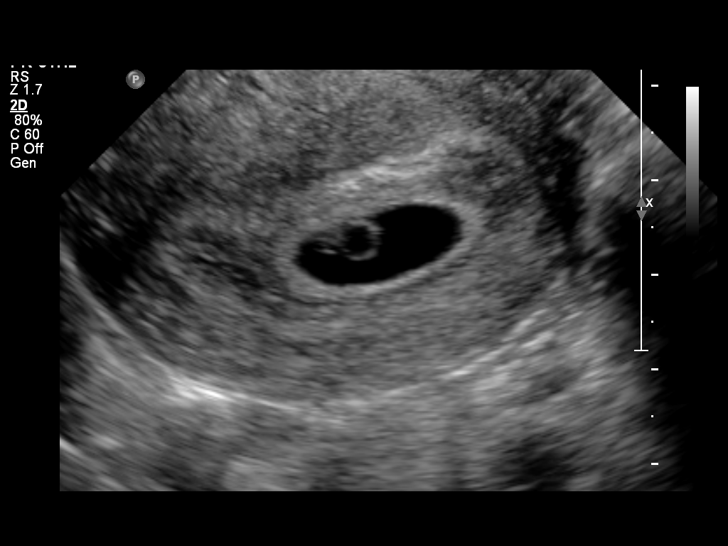
[im 12/28]
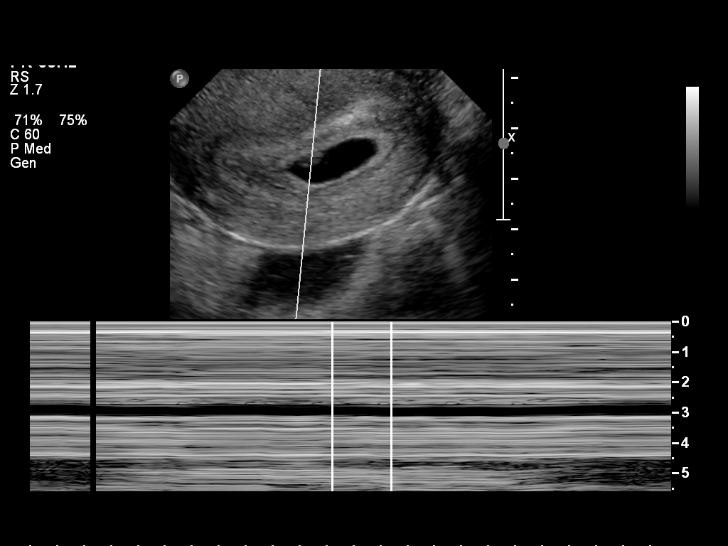
[im 14/28]
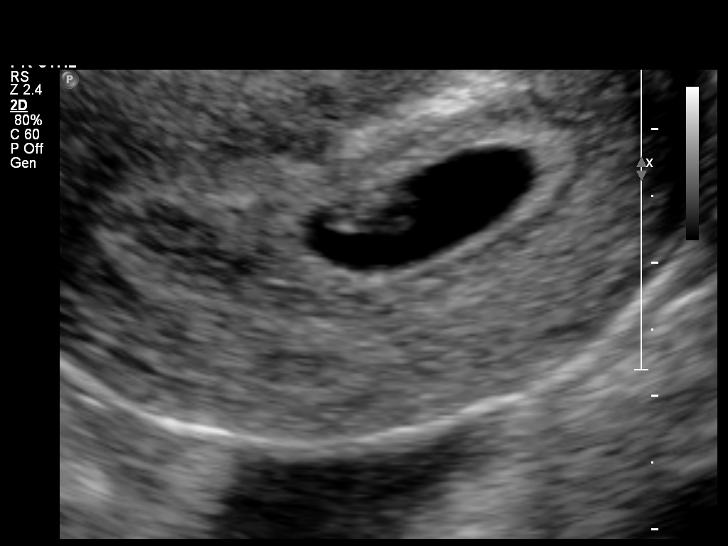
[im 16/28]
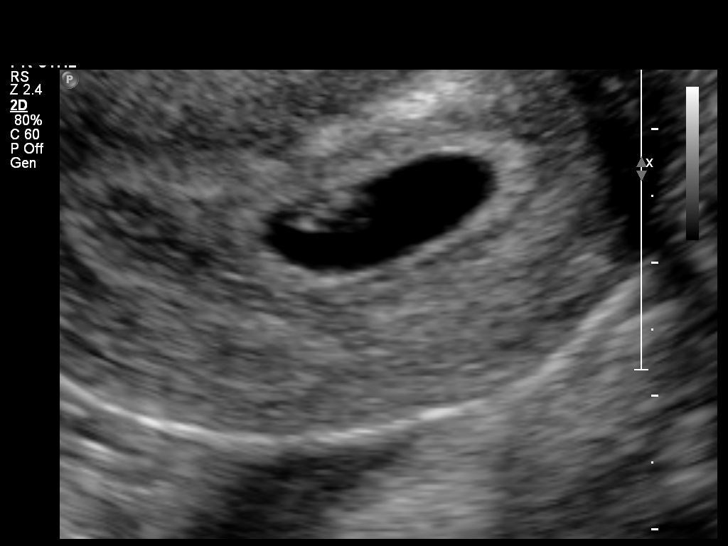
[im 18/28]
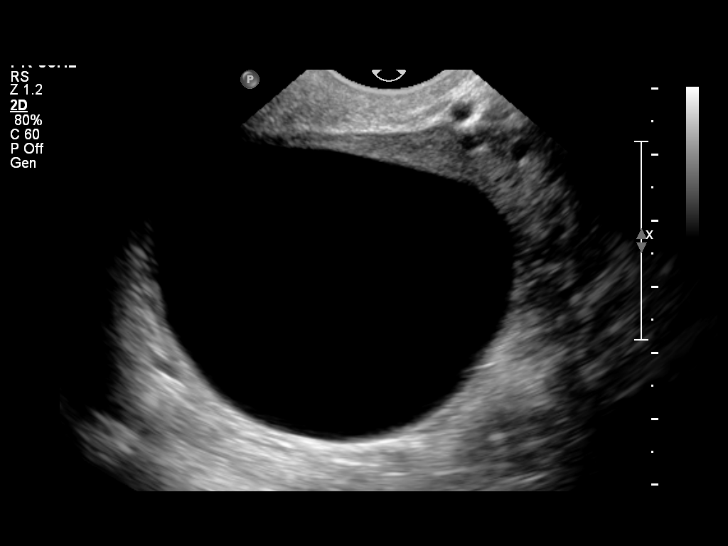
[im 20/28]
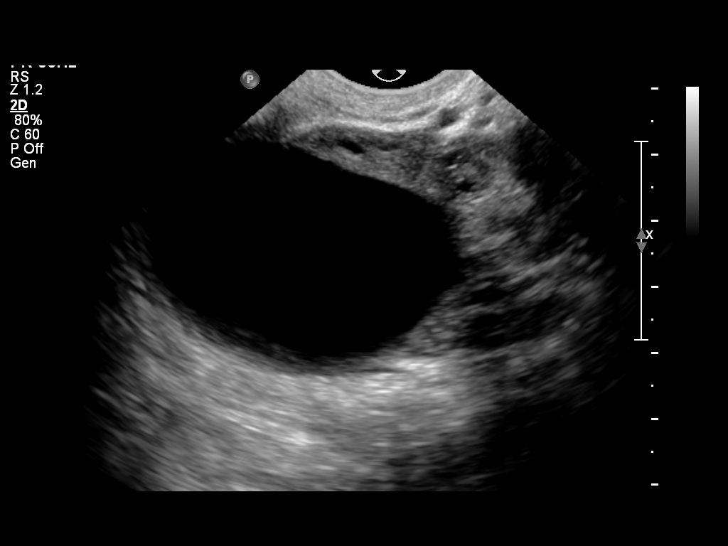
[im 22/28]
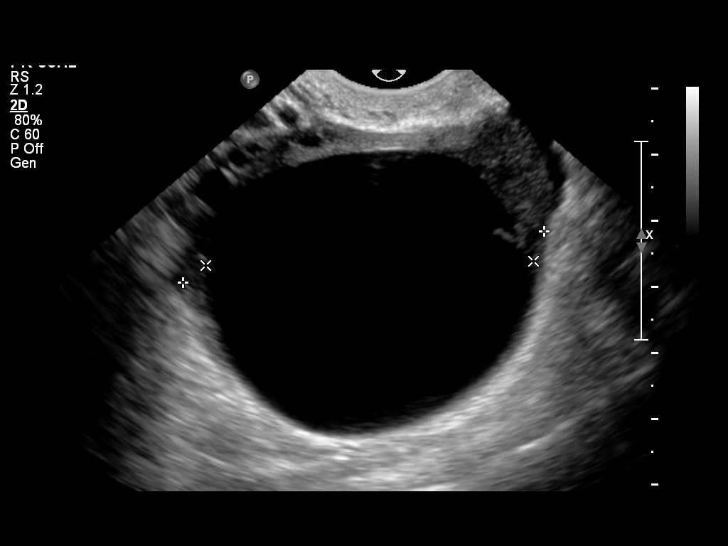
[im 24/28]
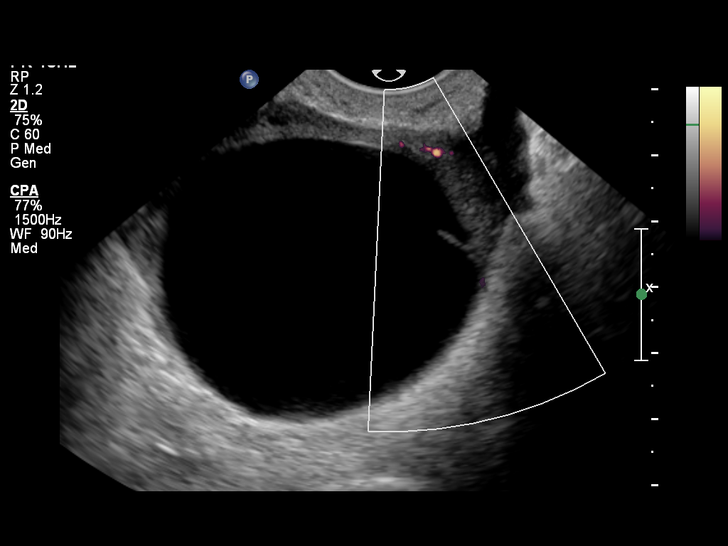
[im 26/28]
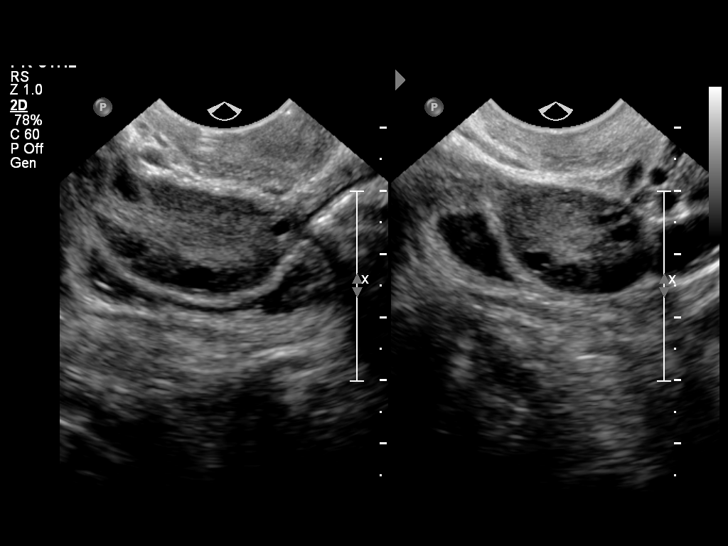
[im 28/28]
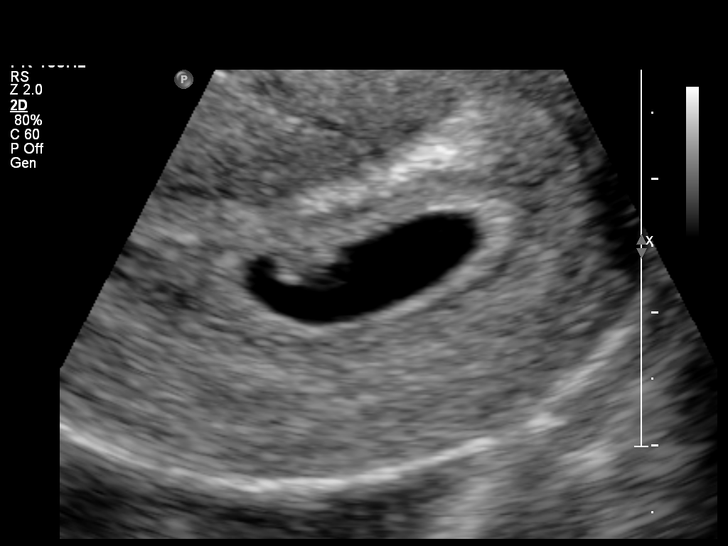

[14 of 28 positions shown; findings below may reference images not displayed]

FINDINGS: Single intrauterine gestation.  By crown-rump length,
estimated gestational age is 6 weeks 2 days.  Fetal heart rate 137
beats per minute.  No subchorionic hemorrhage.

Right ovary unremarkable.  Again noted is a large cyst within the
left ovary measuring up to 5.5 cm.  Single internal septation.
IMPRESSION: Single intrauterine gestation, 6-week-6-day.  Fetal heart rate 137
beats per minute.

Stable large left ovarian cyst.

## 2012-05-26 ENCOUNTER — Encounter: Payer: BC Managed Care – PPO | Admitting: Obstetrics and Gynecology

## 2012-05-27 ENCOUNTER — Inpatient Hospital Stay (HOSPITAL_COMMUNITY)
Admission: AD | Admit: 2012-05-27 | Discharge: 2012-05-27 | Disposition: A | Discharge status: Home / Self Care | Payer: BC Managed Care – PPO | Source: Ambulatory Visit | Attending: Obstetrics & Gynecology | Admitting: Obstetrics & Gynecology

## 2012-05-27 DIAGNOSIS — R197 Diarrhea, unspecified: Secondary | ICD-10-CM | POA: Insufficient documentation

## 2012-05-27 DIAGNOSIS — R109 Unspecified abdominal pain: Secondary | ICD-10-CM | POA: Insufficient documentation

## 2012-05-27 LAB — URINALYSIS, ROUTINE W REFLEX MICROSCOPIC
Nitrite: NEGATIVE
Specific Gravity, Urine: 1.02 (ref 1.005–1.030)
Urobilinogen, UA: 0.2 mg/dL (ref 0.0–1.0)

## 2012-05-27 LAB — URINE MICROSCOPIC-ADD ON

## 2012-05-27 LAB — POCT PREGNANCY, URINE: Preg Test, Ur: NEGATIVE

## 2012-05-27 NOTE — MAU Note (Signed)
Not in the lobby 

## 2012-05-27 NOTE — MAU Note (Signed)
Pt called to room. Pt not in lobby

## 2012-05-27 NOTE — MAU Note (Signed)
Pt not in lobby.  

## 2012-05-27 NOTE — MAU Note (Signed)
Patient states she has had diarrhea and abdominal cramping for 2 days. Is due her next Depo injection on 1-3. States she has had bleeding for 3 months since the injection. Has a 75 month old baby.

## 2012-05-28 LAB — URINE CULTURE: Colony Count: 85000

## 2012-06-21 ENCOUNTER — Emergency Department (HOSPITAL_COMMUNITY)
Admission: EM | Admit: 2012-06-21 | Discharge: 2012-06-21 | Disposition: A | Discharge status: Home / Self Care | Payer: BC Managed Care – PPO | Attending: Emergency Medicine | Admitting: Emergency Medicine

## 2012-06-21 ENCOUNTER — Encounter (HOSPITAL_COMMUNITY): Payer: Self-pay | Admitting: *Deleted

## 2012-06-21 DIAGNOSIS — Z8619 Personal history of other infectious and parasitic diseases: Secondary | ICD-10-CM | POA: Insufficient documentation

## 2012-06-21 DIAGNOSIS — R05 Cough: Secondary | ICD-10-CM | POA: Insufficient documentation

## 2012-06-21 DIAGNOSIS — B9789 Other viral agents as the cause of diseases classified elsewhere: Secondary | ICD-10-CM | POA: Insufficient documentation

## 2012-06-21 DIAGNOSIS — Z87891 Personal history of nicotine dependence: Secondary | ICD-10-CM | POA: Insufficient documentation

## 2012-06-21 DIAGNOSIS — J3489 Other specified disorders of nose and nasal sinuses: Secondary | ICD-10-CM | POA: Insufficient documentation

## 2012-06-21 DIAGNOSIS — R059 Cough, unspecified: Secondary | ICD-10-CM | POA: Insufficient documentation

## 2012-06-21 DIAGNOSIS — Z8742 Personal history of other diseases of the female genital tract: Secondary | ICD-10-CM | POA: Insufficient documentation

## 2012-06-21 DIAGNOSIS — B349 Viral infection, unspecified: Secondary | ICD-10-CM

## 2012-06-21 NOTE — ED Provider Notes (Signed)
History     CSN: [id number]  Arrival date & time 06/21/12  1030   First MD Initiated Contact with Patient 06/21/12 1055      No chief complaint on file.   (Consider location/radiation/quality/duration/timing/severity/associated sxs/prior treatment) The history is provided by the patient.  Kelly Chan is a 22 y.o. female hx of BV, gonorrhea here with cough and congestion. Nonproductive cough and sinus congestion for over the last week. Her baby also has similar symptoms. Denies any fever or history of asthma. She is supposed to get to work but needs to be her baby in for evaluation needs a work note.    Past Medical History  Diagnosis Date  . Gonorrhea   . BV (bacterial vaginosis)   . Abnormal Pap smear 06/2011  . H/O candidiasis   . History of bacterial infection   . Yeast infection   . Pregnancy induced hypertension     Past Surgical History  Procedure Date  . Wisdom tooth extraction   . Cesarean section 01/17/2012    Procedure: CESAREAN SECTION;  Surgeon: Esmeralda Arthur, MD;  Location: [facility];  Service: Obstetrics;  Laterality: N/A;  Priimary Cesarean Section Delivery Boy @0044 , Apgars 9/9    Family History  Problem Relation Age of Onset  . Cancer Maternal Aunt     Breast   . Cancer Maternal Aunt     Breast  . Hypertension Maternal Grandmother   . Other Neg Hx     History  Substance Use Topics  . Smoking status: Former Smoker -- 0.2 packs/day    Quit date: 05/25/2011  . Smokeless tobacco: Never Used     Comment: "I've quit because I can't tolerate the smell anymore."  . Alcohol Use: No    OB History    Grav Para Term Preterm Abortions TAB SAB Ect Mult Living   1 1 1  0 0 0 0 0 0 1      Review of Systems  HENT: Positive for congestion.   Respiratory: Positive for cough.   All other systems reviewed and are negative.    Allergies  Review of patient's allergies indicates no known allergies.  Home Medications   Current Outpatient Rx  Name   Route  Sig  Dispense  Refill  . MEDROXYPROGESTERONE ACETATE 150 MG/ML IM SUSP   Intramuscular   Inject 1 mL (150 mg total) into the muscle every 3 (three) months.   1 mL   2     There were no vitals taken for this visit.  Physical Exam  Nursing note and vitals reviewed. Constitutional: She is oriented to person, place, and time. She appears well-developed and well-nourished.       Well appearing, NAD   HENT:  Head: Normocephalic.  Mouth/Throat: Oropharynx is clear and moist.  Eyes: Conjunctivae normal are normal. Pupils are equal, round, and reactive to light.  Neck: Normal range of motion. Neck supple.  Cardiovascular: Normal rate, regular rhythm and normal heart sounds.   Pulmonary/Chest: Effort normal and breath sounds normal. No respiratory distress. She has no wheezes. She has no rales.  Abdominal: Soft. Bowel sounds are normal. She exhibits no distension. There is no tenderness. There is no rebound and no guarding.  Musculoskeletal: Normal range of motion.  Neurological: She is alert and oriented to person, place, and time.  Skin: Skin is warm and dry.  Psychiatric: She has a normal mood and affect. Her behavior is normal. Judgment and thought content normal.  ED Course  Procedures (including critical care time)  Labs Reviewed - No data to display No results found.   No diagnosis found.    MDM  Kelly Chan is a 22 y.o. female here with viral syndrome. Vitals reassuring, no hypoxic, no wheezing. Recommend conservative management. Will give work note. Return precautions given.          Richardean Canal, MD 06/21/12 1101

## 2012-06-21 NOTE — ED Notes (Signed)
Pt has mild cold like symptoms for the past couple of days.  No fevers or other issues reported.  NAD on arrival.

## 2012-07-22 ENCOUNTER — Inpatient Hospital Stay (HOSPITAL_COMMUNITY)
Admission: AD | Admit: 2012-07-22 | Discharge: 2012-07-22 | Disposition: A | Discharge status: Home / Self Care | Payer: BC Managed Care – PPO | Source: Ambulatory Visit | Attending: Obstetrics & Gynecology | Admitting: Obstetrics & Gynecology

## 2012-07-22 ENCOUNTER — Encounter (HOSPITAL_COMMUNITY): Payer: Self-pay | Admitting: *Deleted

## 2012-07-22 DIAGNOSIS — N949 Unspecified condition associated with female genital organs and menstrual cycle: Secondary | ICD-10-CM | POA: Insufficient documentation

## 2012-07-22 DIAGNOSIS — N760 Acute vaginitis: Secondary | ICD-10-CM | POA: Insufficient documentation

## 2012-07-22 DIAGNOSIS — B3731 Acute candidiasis of vulva and vagina: Secondary | ICD-10-CM

## 2012-07-22 DIAGNOSIS — L293 Anogenital pruritus, unspecified: Secondary | ICD-10-CM | POA: Insufficient documentation

## 2012-07-22 DIAGNOSIS — B9689 Other specified bacterial agents as the cause of diseases classified elsewhere: Secondary | ICD-10-CM

## 2012-07-22 DIAGNOSIS — B373 Candidiasis of vulva and vagina: Secondary | ICD-10-CM | POA: Insufficient documentation

## 2012-07-22 DIAGNOSIS — A499 Bacterial infection, unspecified: Secondary | ICD-10-CM | POA: Insufficient documentation

## 2012-07-22 HISTORY — DX: Chlamydial infection, unspecified: A74.9

## 2012-07-22 LAB — WET PREP, GENITAL

## 2012-07-22 LAB — POCT PREGNANCY, URINE: Preg Test, Ur: NEGATIVE

## 2012-07-22 MED ORDER — METRONIDAZOLE 500 MG PO TABS: 500.0000 mg | ORAL_TABLET | Freq: Two times a day (BID) | ORAL | Status: DC

## 2012-07-22 MED ORDER — FLUCONAZOLE 150 MG PO TABS
150.0000 mg | ORAL_TABLET | Freq: Once | ORAL | Status: AC
  Administered 2012-07-22: 150 mg via ORAL
  Filled 2012-07-22: qty 1

## 2012-07-22 NOTE — MAU Provider Note (Signed)
Attestation of Attending Supervision of Advanced Practitioner (CNM/NP): Evaluation and management procedures were performed by the Advanced Practitioner under my supervision and collaboration.  I have reviewed the Advanced Practitioner's note and chart, and I agree with the management and plan.  HARRAWAY-SMITH, Lesha Jager 11:11 PM

## 2012-07-22 NOTE — MAU Note (Signed)
Vag discharge and itching, first noted about a week ago. Tried OTC monistat- did not help.

## 2012-07-22 NOTE — MAU Provider Note (Signed)
History     CSN: [id number]  Arrival date and time: 07/22/12 1432   First Provider Initiated Contact with Patient 07/22/12 1507      Chief Complaint  Patient presents with  . Vaginal Discharge   HPI Kelly Chan is a 22 yo female who presents to the MAU complaining of vaginal itching and vaginal discharge. She reports that both the vaginal itching and discharge started about 1 week ago. She describes the discharge as thick and white. She reports that she tried using Monistat OTC, however had no relief. She also reports that she had sex with her partner 2-3 weeks ago for the first time since she had their baby in August 2013. She states that she is worried about STIs and would like to be tested.  She also reports that over the last 3 years she has been diagnosed with both Gonorrhea and Chlamydia while with the same partner she is now. She also was treated for trichomonas during her last pregnancy. She one has one partner, and they do not use condoms. She denies any abdominal pain or cramping. She also denies any irregular vaginal bleeding, and reports that her LMP was Feb. 1st. After her delivery in August, she had a shot of Depo for contraception, however bled a lot and decided to stop the Depo. She reports she gets her OBGYN care at [facility]. She also denies fevers, chills or urinary symptoms.  OB History   Grav Para Term Preterm Abortions TAB SAB Ect Mult Living   1 1 1  0 0 0 0 0 0 1      Past Medical History  Diagnosis Date  . Gonorrhea   . BV (bacterial vaginosis)   . Abnormal Pap smear 06/2011  . H/O candidiasis   . History of bacterial infection   . Yeast infection   . Pregnancy induced hypertension   . Chlamydia     Past Surgical History  Procedure Laterality Date  . Wisdom tooth extraction    . Cesarean section  01/17/2012    Procedure: CESAREAN SECTION;  Surgeon: Esmeralda Arthur, MD;  Location: [facility];  Service: Obstetrics;  Laterality: N/A;  Priimary  Cesarean Section Delivery Boy @0044 , Apgars 9/9    Family History  Problem Relation Age of Onset  . Cancer Maternal Aunt     Breast   . Cancer Maternal Aunt     Breast  . Hypertension Maternal Grandmother   . Other Neg Hx     History  Substance Use Topics  . Smoking status: Former Smoker -- 0.25 packs/day    Quit date: 05/25/2011  . Smokeless tobacco: Never Used     Comment: "I've quit because I can't tolerate the smell anymore."  . Alcohol Use: No    Allergies: No Known Allergies  No prescriptions prior to admission    Review of Systems  Constitutional: Negative for fever and chills.  Respiratory: Negative for cough and shortness of breath.   Cardiovascular: Negative for chest pain and leg swelling.  Gastrointestinal: Negative for nausea, vomiting, abdominal pain, diarrhea and constipation.  Genitourinary: Negative for dysuria, urgency and frequency.  Musculoskeletal: Negative for back pain.  Skin: Positive for itching (vaginal itching).  Neurological: Negative for dizziness and headaches.  Psychiatric/Behavioral: Negative for depression.    Physical Exam   Blood pressure 126/65, pulse 82, temperature 98.2 F (36.8 C), temperature source Oral, resp. rate 18, height 5\' 6"  (1.676 m), weight 88.905 kg (196 lb), last menstrual period 06/29/2012, unknown  if currently breastfeeding.  Physical Exam Physical Examination: General appearance - alert, well appearing, and in no distress and oriented to person, place, and time Mental status - alert, oriented to person, place, and time, normal mood, behavior, speech, dress, motor activity, and thought processes Eyes - pupils equal and reactive, extraocular eye movements intact Neck - supple, no significant adenopathy Chest - clear to auscultation, no wheezes, rales or rhonchi, symmetric air entry Heart - normal rate, regular rhythm, normal S1, S2, no murmurs, rubs, clicks or gallops Abdomen - soft, nontender, nondistended, no  masses or organomegaly, bowel sounds normal Pelvic - VULVA: normal appearing vulva with no masses, tenderness or lesions, VAGINA: normal appearing vagina with normal color, no lesions, moderate vaginal discharge - white and curd-like, CERVIX: normal appearing cervix without discharge or lesions, UTERUS: uterus is normal size, shape, consistency and nontender, ADNEXA: normal adnexa in size, nontender and no masses Extremities - peripheral pulses normal, no pedal edema, no clubbing or cyanosis Skin - normal coloration and turgor, no rashes, no suspicious skin lesions noted    MAU Course  Procedures  Results for orders placed during the hospital encounter of 07/22/12 (from the past 24 hour(s))  WET PREP, GENITAL     Status: Abnormal   Collection Time    07/22/12  3:20 PM      Result Value Range   Yeast Wet Prep HPF POC OILY SUBSTANCE PRESENT  NONE SEEN   Trich, Wet Prep NONE SEEN  NONE SEEN   Clue Cells Wet Prep HPF POC MODERATE (*) NONE SEEN   WBC, Wet Prep HPF POC FEW (*) NONE SEEN  POCT PREGNANCY, URINE     Status: None   Collection Time    07/22/12  3:37 PM      Result Value Range   Preg Test, Ur NEGATIVE  NEGATIVE    Assessment and Plan  Assessment: Kelly Chan is a 22 yo female who presents to the MAU complaining of vaginal itching and discharge. 1. Vaginal canidiasis 2. Bacterial vaginosis  Plan: 1. Vaginal candidiasis: Treatment with Diflucan 150 mg PO x1 2. Bacterial vaginosis: Treatment with Flagyl 500 mg BID x7 days 3. Contraception: Discussed STI and contraception with patient. Patient reports she will follow up with [facility] to discuss contraception.  Adela Glimpse 07/22/2012, 3:26 PM  I have examined this patient with the student and assisted with plan of care.

## 2012-10-06 ENCOUNTER — Encounter (HOSPITAL_COMMUNITY): Payer: Self-pay | Admitting: *Deleted

## 2012-10-06 ENCOUNTER — Inpatient Hospital Stay (HOSPITAL_COMMUNITY)
Admission: AD | Admit: 2012-10-06 | Discharge: 2012-10-06 | Disposition: A | Discharge status: Home / Self Care | Payer: Self-pay | Source: Ambulatory Visit | Attending: Obstetrics & Gynecology | Admitting: Obstetrics & Gynecology

## 2012-10-06 DIAGNOSIS — Z202 Contact with and (suspected) exposure to infections with a predominantly sexual mode of transmission: Secondary | ICD-10-CM | POA: Insufficient documentation

## 2012-10-06 LAB — POCT PREGNANCY, URINE: Preg Test, Ur: NEGATIVE

## 2012-10-06 LAB — URINALYSIS, ROUTINE W REFLEX MICROSCOPIC
Hgb urine dipstick: NEGATIVE
Leukocytes, UA: NEGATIVE
Protein, ur: NEGATIVE mg/dL
Urobilinogen, UA: 0.2 mg/dL (ref 0.0–1.0)

## 2012-10-06 NOTE — Progress Notes (Signed)
Kelly Chan cnm called patient, not in lobby

## 2012-10-06 NOTE — MAU Note (Signed)
Patient is in to be tested for STD,

## 2012-10-06 NOTE — Progress Notes (Signed)
Patient not in lobby

## 2012-10-06 NOTE — MAU Note (Signed)
Pt has an emergency at home with her child.  Pt can not stay for evaluation.  Pt signed AMA form.

## 2013-01-03 ENCOUNTER — Encounter (HOSPITAL_COMMUNITY): Payer: Self-pay

## 2013-01-03 ENCOUNTER — Inpatient Hospital Stay (HOSPITAL_COMMUNITY)
Admission: AD | Admit: 2013-01-03 | Discharge: 2013-01-03 | Disposition: A | Discharge status: Home / Self Care | Payer: BC Managed Care – PPO | Source: Ambulatory Visit | Attending: Obstetrics & Gynecology | Admitting: Obstetrics & Gynecology

## 2013-01-03 DIAGNOSIS — B3731 Acute candidiasis of vulva and vagina: Secondary | ICD-10-CM | POA: Insufficient documentation

## 2013-01-03 DIAGNOSIS — B373 Candidiasis of vulva and vagina: Secondary | ICD-10-CM | POA: Insufficient documentation

## 2013-01-03 DIAGNOSIS — N949 Unspecified condition associated with female genital organs and menstrual cycle: Secondary | ICD-10-CM | POA: Insufficient documentation

## 2013-01-03 DIAGNOSIS — L293 Anogenital pruritus, unspecified: Secondary | ICD-10-CM | POA: Insufficient documentation

## 2013-01-03 DIAGNOSIS — B379 Candidiasis, unspecified: Secondary | ICD-10-CM

## 2013-01-03 HISTORY — DX: Cyst of Bartholin's gland: N75.0

## 2013-01-03 LAB — WET PREP, GENITAL: Trich, Wet Prep: NONE SEEN

## 2013-01-03 LAB — URINE MICROSCOPIC-ADD ON

## 2013-01-03 LAB — URINALYSIS, ROUTINE W REFLEX MICROSCOPIC
Glucose, UA: NEGATIVE mg/dL
Hgb urine dipstick: NEGATIVE
Protein, ur: NEGATIVE mg/dL
Specific Gravity, Urine: >1.03 — ABNORMAL HIGH (ref 1.005–1.030)
Urobilinogen, UA: 0.2 mg/dL (ref 0.0–1.0)

## 2013-01-03 LAB — POCT PREGNANCY, URINE: Preg Test, Ur: NEGATIVE

## 2013-01-03 MED ORDER — FLUCONAZOLE 150 MG PO TABS: 150.0000 mg | ORAL_TABLET | Freq: Once | ORAL | Status: DC

## 2013-01-03 NOTE — MAU Provider Note (Signed)
History     CSN: [id number]  Arrival date and time: 01/03/13 1559   First Provider Initiated Contact with Patient 01/03/13 1752      Chief Complaint  Patient presents with  . Vaginal Discharge   HPI Kelly Chan, 22 y.o. Comes to MAU with vaginal discharge x 2 weeks and vaginal itching x 2 days.  Thinks she has a yeast infection and wants the pill for treatment.  OB History   Grav Para Term Preterm Abortions TAB SAB Ect Mult Living   1 1 1  0 0 0 0 0 0 1      Past Medical History  Diagnosis Date  . Gonorrhea   . BV (bacterial vaginosis)   . Abnormal Pap smear 06/2011  . H/O candidiasis   . History of bacterial infection   . Yeast infection   . Pregnancy induced hypertension   . Chlamydia   . Bartholin cyst     w ward catheter    Past Surgical History  Procedure Laterality Date  . Wisdom tooth extraction    . Cesarean section  01/17/2012    Procedure: CESAREAN SECTION;  Surgeon: Esmeralda Arthur, MD;  Location: [facility];  Service: Obstetrics;  Laterality: N/A;  Priimary Cesarean Section Delivery Boy @0044 , Apgars 9/9    Family History  Problem Relation Age of Onset  . Cancer Maternal Aunt     Breast   . Cancer Maternal Aunt     Breast  . Hypertension Maternal Grandmother   . Other Neg Hx     History  Substance Use Topics  . Smoking status: Current Every Day Smoker -- 0.25 packs/day    Last Attempt to Quit: 05/25/2011  . Smokeless tobacco: Never Used     Comment: "I've quit because I can't tolerate the smell anymore."  . Alcohol Use: No    Allergies: No Known Allergies  No prescriptions prior to admission    Review of Systems  Constitutional: Negative for fever.  Gastrointestinal: Negative for nausea, vomiting, abdominal pain, diarrhea and constipation.  Genitourinary:       Vaginal discharge. No vaginal bleeding. No dysuria.   Physical Exam   Blood pressure 113/68, pulse 91, temperature 97.9 F (36.6 C), temperature source Oral, resp. rate  16, height 5\' 7"  (1.702 m), weight 192 lb 8 oz (87.317 kg), last menstrual period 12/20/2012, not currently breastfeeding.  Physical Exam  Nursing note and vitals reviewed. Constitutional: She is oriented to person, place, and time. She appears well-developed and well-nourished. No distress.  HENT:  Head: Normocephalic.  Eyes: EOM are normal.  Neck: Neck supple.  GI: Soft. There is no tenderness. There is no rebound and no guarding.  Genitourinary:  Speculum exam: Vagina - Some erythema, mall amount of grainy, adherent discharge, no odor Cervix - No contact bleeding Bimanual exam: Cervix closed Uterus non tender, normal size Adnexa non tender, no masses bilaterally GC/Chlam, wet prep done Chaperone present for exam.  Musculoskeletal: Normal range of motion.  Neurological: She is alert and oriented to person, place, and time.  Skin: Skin is warm and dry.  Psychiatric: She has a normal mood and affect.    MAU Course  Procedures Results for orders placed during the hospital encounter of 01/03/13 (from the past 24 hour(s))  URINALYSIS, ROUTINE W REFLEX MICROSCOPIC     Status: Abnormal   Collection Time    01/03/13  4:15 PM      Result Value Range   Color, Urine YELLOW  YELLOW   APPearance HAZY (*) CLEAR   Specific Gravity, Urine >1.030 (*) 1.005 - 1.030   pH 6.0  5.0 - 8.0   Glucose, UA NEGATIVE  NEGATIVE mg/dL   Hgb urine dipstick NEGATIVE  NEGATIVE   Bilirubin Urine NEGATIVE  NEGATIVE   Ketones, ur NEGATIVE  NEGATIVE mg/dL   Protein, ur NEGATIVE  NEGATIVE mg/dL   Urobilinogen, UA 0.2  0.0 - 1.0 mg/dL   Nitrite NEGATIVE  NEGATIVE   Leukocytes, UA TRACE (*) NEGATIVE  URINE MICROSCOPIC-ADD ON     Status: Abnormal   Collection Time    01/03/13  4:15 PM      Result Value Range   Squamous Epithelial / LPF MANY (*) RARE   WBC, UA 0-2  <3 WBC/hpf   RBC / HPF 0-2  <3 RBC/hpf   Bacteria, UA MANY (*) RARE  POCT PREGNANCY, URINE     Status: None   Collection Time    01/03/13   4:39 PM      Result Value Range   Preg Test, Ur NEGATIVE  NEGATIVE  WET PREP, GENITAL     Status: Abnormal   Collection Time    01/03/13  6:10 PM      Result Value Range   Yeast Wet Prep HPF POC FEW (*) NONE SEEN   Trich, Wet Prep NONE SEEN  NONE SEEN   Clue Cells Wet Prep HPF POC MANY (*) NONE SEEN   WBC, Wet Prep HPF POC MODERATE (*) NONE SEEN    MDM Will treat for yeast.  No odor detected.  Client not complaining of odor but of itching.  Assessment and Plan  Yeast infection  Plan  Diflucan 150 mg PO single dose for yeast  BURLESON,TERRI 01/03/2013, 6:09 PM

## 2013-01-03 NOTE — MAU Note (Signed)
Pt states here for white vaginal discharge, itching, and odor. Discharge x2 weeks, odor and itching began 2 days ago.

## 2013-01-03 NOTE — MAU Provider Note (Signed)
Attestation of Attending Supervision of Advanced Practitioner (CNM/NP): Evaluation and management procedures were performed by the Advanced Practitioner under my supervision and collaboration.  I have reviewed the Advanced Practitioner's note and chart, and I agree with the management and plan.  HARRAWAY-SMITH, Youssef Footman 11:52 PM     

## 2013-09-07 ENCOUNTER — Emergency Department (INDEPENDENT_AMBULATORY_CARE_PROVIDER_SITE_OTHER)
Admission: EM | Admit: 2013-09-07 | Discharge: 2013-09-07 | Disposition: A | Discharge status: Home / Self Care | Payer: BC Managed Care – PPO | Source: Home / Self Care | Attending: Family Medicine | Admitting: Family Medicine

## 2013-09-07 ENCOUNTER — Encounter (HOSPITAL_COMMUNITY): Payer: Self-pay | Admitting: Emergency Medicine

## 2013-09-07 ENCOUNTER — Other Ambulatory Visit (HOSPITAL_COMMUNITY)
Admission: RE | Admit: 2013-09-07 | Discharge: 2013-09-07 | Disposition: A | Discharge status: Home / Self Care | Payer: BC Managed Care – PPO | Source: Ambulatory Visit | Attending: Family Medicine | Admitting: Family Medicine

## 2013-09-07 DIAGNOSIS — A499 Bacterial infection, unspecified: Secondary | ICD-10-CM

## 2013-09-07 DIAGNOSIS — Z113 Encounter for screening for infections with a predominantly sexual mode of transmission: Secondary | ICD-10-CM | POA: Insufficient documentation

## 2013-09-07 DIAGNOSIS — N760 Acute vaginitis: Secondary | ICD-10-CM

## 2013-09-07 DIAGNOSIS — B9689 Other specified bacterial agents as the cause of diseases classified elsewhere: Secondary | ICD-10-CM

## 2013-09-07 MED ORDER — METRONIDAZOLE 0.75 % VA GEL: 1.0000 | Freq: Every day | VAGINAL | Status: DC

## 2013-09-07 NOTE — ED Provider Notes (Signed)
CSN: [id number]     Arrival date & time 09/07/13  1335 History   First MD Initiated Contact with Patient 09/07/13 1512     Chief Complaint  Patient presents with  . Vaginal Discharge   (Consider location/radiation/quality/duration/timing/severity/associated sxs/prior Treatment) Patient is a 23 y.o. female presenting with vaginal discharge. The history is provided by the patient.  Vaginal Discharge Quality:  Malodorous and white Severity:  Mild Onset quality:  Gradual Duration:  4 days Progression:  Unchanged Chronicity:  New Associated symptoms: no dysuria, no fever, no genital lesions, no nausea, no urinary frequency, no urinary hesitancy, no urinary incontinence and no vomiting   Risk factors: new sexual partner, STI and unprotected sex   Risk factors: no STI exposure     Past Medical History  Diagnosis Date  . Gonorrhea   . BV (bacterial vaginosis)   . Abnormal Pap smear 06/2011  . H/O candidiasis   . History of bacterial infection   . Yeast infection   . Pregnancy induced hypertension   . Chlamydia   . Bartholin cyst     w ward catheter   Past Surgical History  Procedure Laterality Date  . Wisdom tooth extraction    . Cesarean section  01/17/2012    Procedure: CESAREAN SECTION;  Surgeon: Esmeralda ArthurSandra A Rivard, MD;  Location: [facility];  Service: Obstetrics;  Laterality: N/A;  Priimary Cesarean Section Delivery Boy @0044 , Apgars 9/9   Family History  Problem Relation Age of Onset  . Cancer Maternal Aunt     Breast   . Cancer Maternal Aunt     Breast  . Hypertension Maternal Grandmother   . Other Neg Hx    History  Substance Use Topics  . Smoking status: Current Every Day Smoker -- 0.25 packs/day    Last Attempt to Quit: 05/25/2011  . Smokeless tobacco: Never Used     Comment: "I've quit because I can't tolerate the smell anymore."  . Alcohol Use: Yes   OB History   Grav Para Term Preterm Abortions TAB SAB Ect Mult Living   1 1 1  0 0 0 0 0 0 1     Review of  Systems  Constitutional: Negative for fever.  Gastrointestinal: Negative.  Negative for nausea and vomiting.  Genitourinary: Positive for vaginal discharge. Negative for bladder incontinence, dysuria, hesitancy, vaginal bleeding, menstrual problem and pelvic pain.    Allergies  Review of patient's allergies indicates no known allergies.  Home Medications   Current Outpatient Rx  Name  Route  Sig  Dispense  Refill  . fluconazole (DIFLUCAN) 150 MG tablet   Oral   Take 1 tablet (150 mg total) by mouth once.   1 tablet   0   . metroNIDAZOLE (METROGEL VAGINAL) 0.75 % vaginal gel   Vaginal   Place 1 Applicatorful vaginally at bedtime. For 5 nights   70 g   1    BP 110/77  Pulse 88  Temp(Src) 98.2 F (36.8 C) (Oral)  Resp 16  LMP 08/30/2013 Physical Exam  Nursing note and vitals reviewed. Constitutional: She is oriented to person, place, and time. She appears well-developed and well-nourished.  Abdominal: Soft. Bowel sounds are normal.  Genitourinary: Vagina normal and uterus normal. No vaginal discharge found.  Neurological: She is alert and oriented to person, place, and time.  Skin: Skin is warm and dry.    ED Course  Procedures (including critical care time) Labs Review Labs Reviewed  CERVICOVAGINAL ANCILLARY ONLY   Imaging Review  No results found.   MDM   1. BV (bacterial vaginosis)        Linna HoffJames D Rajesh Wyss, MD 09/07/13 1558

## 2013-09-07 NOTE — Discharge Instructions (Signed)
Use medicine as prescribed, we will call if tests show a need for other treatment.  

## 2013-09-07 NOTE — ED Notes (Signed)
Vaginal discharge that was noticed 4 days ago

## 2013-09-08 LAB — CERVICOVAGINAL ANCILLARY ONLY
Chlamydia: NEGATIVE
Neisseria Gonorrhea: NEGATIVE
WET PREP (BD AFFIRM): NEGATIVE
WET PREP (BD AFFIRM): NEGATIVE
WET PREP (BD AFFIRM): POSITIVE — AB

## 2013-09-09 NOTE — ED Notes (Signed)
GC/Chlamydia neg., Affirm: Candida and Trich neg., Gardnerella pos. Pt. adequately treated with Metrogel. [facility]

## 2013-11-22 ENCOUNTER — Encounter (HOSPITAL_COMMUNITY): Payer: Self-pay | Admitting: Emergency Medicine

## 2013-11-22 ENCOUNTER — Emergency Department (HOSPITAL_COMMUNITY)
Admission: EM | Admit: 2013-11-22 | Discharge: 2013-11-22 | Disposition: A | Discharge status: Home / Self Care | Payer: BC Managed Care – PPO | Attending: Emergency Medicine | Admitting: Emergency Medicine

## 2013-11-22 DIAGNOSIS — Z3202 Encounter for pregnancy test, result negative: Secondary | ICD-10-CM | POA: Insufficient documentation

## 2013-11-22 DIAGNOSIS — N760 Acute vaginitis: Secondary | ICD-10-CM | POA: Insufficient documentation

## 2013-11-22 DIAGNOSIS — A499 Bacterial infection, unspecified: Secondary | ICD-10-CM | POA: Insufficient documentation

## 2013-11-22 DIAGNOSIS — Z792 Long term (current) use of antibiotics: Secondary | ICD-10-CM | POA: Insufficient documentation

## 2013-11-22 DIAGNOSIS — R109 Unspecified abdominal pain: Secondary | ICD-10-CM | POA: Insufficient documentation

## 2013-11-22 DIAGNOSIS — Z8619 Personal history of other infectious and parasitic diseases: Secondary | ICD-10-CM | POA: Insufficient documentation

## 2013-11-22 DIAGNOSIS — F17200 Nicotine dependence, unspecified, uncomplicated: Secondary | ICD-10-CM | POA: Insufficient documentation

## 2013-11-22 DIAGNOSIS — B9689 Other specified bacterial agents as the cause of diseases classified elsewhere: Secondary | ICD-10-CM | POA: Insufficient documentation

## 2013-11-22 LAB — WET PREP, GENITAL
TRICH WET PREP: NONE SEEN
YEAST WET PREP: NONE SEEN

## 2013-11-22 LAB — URINALYSIS, ROUTINE W REFLEX MICROSCOPIC
Bilirubin Urine: NEGATIVE
GLUCOSE, UA: NEGATIVE mg/dL
Hgb urine dipstick: NEGATIVE
Ketones, ur: NEGATIVE mg/dL
LEUKOCYTES UA: NEGATIVE
Nitrite: NEGATIVE
PH: 5.5 (ref 5.0–8.0)
Protein, ur: NEGATIVE mg/dL
SPECIFIC GRAVITY, URINE: 1.03 (ref 1.005–1.030)
Urobilinogen, UA: 0.2 mg/dL (ref 0.0–1.0)

## 2013-11-22 LAB — PREGNANCY, URINE: PREG TEST UR: NEGATIVE

## 2013-11-22 LAB — RPR

## 2013-11-22 MED ORDER — FLUCONAZOLE 150 MG PO TABS: 150.0000 mg | ORAL_TABLET | Freq: Every day | ORAL | Status: AC

## 2013-11-22 MED ORDER — METRONIDAZOLE 0.75 % VA GEL: 1.0000 | Freq: Two times a day (BID) | VAGINAL | Status: AC

## 2013-11-22 NOTE — Discharge Instructions (Signed)
Bacterial Vaginosis °Bacterial vaginosis is a vaginal infection that occurs when the normal balance of bacteria in the vagina is disrupted. It results from an overgrowth of certain bacteria. This is the most common vaginal infection in women of childbearing age. Treatment is important to prevent complications, especially in pregnant women, as it can cause a premature delivery. °CAUSES  °Bacterial vaginosis is caused by an increase in harmful bacteria that are normally present in smaller amounts in the vagina. Several different kinds of bacteria can cause bacterial vaginosis. However, the reason that the condition develops is not fully understood. °RISK FACTORS °Certain activities or behaviors can put you at an increased risk of developing bacterial vaginosis, including: °· Having a new sex partner or multiple sex partners. °· Douching. °· Using an intrauterine device (IUD) for contraception. °Women do not get bacterial vaginosis from toilet seats, bedding, swimming pools, or contact with objects around them. °SIGNS AND SYMPTOMS  °Some women with bacterial vaginosis have no signs or symptoms. Common symptoms include: °· Grey vaginal discharge. °· A fishlike odor with discharge, especially after sexual intercourse. °· Itching or burning of the vagina and vulva. °· Burning or pain with urination. °DIAGNOSIS  °Your health care provider will take a medical history and examine the vagina for signs of bacterial vaginosis. A sample of vaginal fluid may be taken. Your health care provider will look at this sample under a microscope to check for bacteria and abnormal cells. A vaginal pH test may also be done.  °TREATMENT  °Bacterial vaginosis may be treated with antibiotic medicines. These may be given in the form of a pill or a vaginal cream. A second round of antibiotics may be prescribed if the condition comes back after treatment.  °HOME CARE INSTRUCTIONS  °· Only take over-the-counter or prescription medicines as  directed by your health care provider. °· If antibiotic medicine was prescribed, take it as directed. Make sure you finish it even if you start to feel better. °· Do not have sex until treatment is completed. °· Tell all sexual partners that you have a vaginal infection. They should see their health care provider and be treated if they have problems, such as a mild rash or itching. °· Practice safe sex by using condoms and only having one sex partner. °SEEK MEDICAL CARE IF:  °· Your symptoms are not improving after 3 days of treatment. °· You have increased discharge or pain. °· You have a fever. °MAKE SURE YOU:  °· Understand these instructions. °· Will watch your condition. °· Will get help right away if you are not doing well or get worse. °FOR MORE INFORMATION  °Centers for Disease Control and Prevention, Division of STD Prevention: www.cdc.gov/std °American Sexual Health Association (ASHA): www.ashastd.org  °Document Released: 05/14/2005 Document Revised: 03/04/2013 Document Reviewed: 12/24/2012 °ExitCare® Patient Information ©2015 [employer]. This information is not intended to replace advice given to you by your health care provider. Make sure you discuss any questions you have with your health care provider. ° ° °Emergency Department Resource Guide °1) Find a Doctor and Pay Out of Pocket °Although you won't have to find out who is covered by your insurance plan, it is a good idea to ask around and get recommendations. You will then need to call the office and see if the doctor you have chosen will accept you as a new patient and what types of options they offer for patients who are self-pay. Some doctors offer discounts or will set up payment plans   for their patients who do not have insurance, but you will need to ask so you aren't surprised when you get to your appointment. ° °2) Contact Your Local Health Department °Not all health departments have doctors that can see patients for sick visits, but many  do, so it is worth a call to see if yours does. If you don't know where your local health department is, you can check in your phone book. The CDC also has a tool to help you locate your state's health department, and many state websites also have listings of all of their local health departments. ° °3) Find a Walk-in Clinic °If your illness is not likely to be very severe or complicated, you may want to try a walk in clinic. These are popping up all over the country in pharmacies, drugstores, and shopping centers. They're usually staffed by nurse practitioners or physician assistants that have been trained to treat common illnesses and complaints. They're usually fairly quick and inexpensive. However, if you have serious medical issues or chronic medical problems, these are probably not your best option. ° °No Primary Care Doctor: °- Call [facility] at  832-8000 - they can help you locate a primary care doctor that  accepts your insurance, provides certain services, etc. °- Physician Referral Service- 1-800-533-3463 ° °Chronic Pain Problems: °Organization         Address  Phone   Notes  °[facility]  (336) 297-2271 Patients need to be referred by their primary care doctor.  ° °Medication Assistance: °Organization         Address  Phone   Notes  °[place] [facility] [address] °[place], [place] [address] (336) 641-8030 --Must be a resident of [place] °-- Must have NO insurance coverage whatsoever (no Medicaid/ Medicare, etc.) °-- The pt. MUST have a primary care doctor that directs their care regularly and follows them in the community °  °[facility]  (866) 331-1348   °[employer]  (888) 892-1162   ° °Agencies that provide inexpensive medical care: °Organization         Address  Phone   Notes  °[facility]  (336) 832-8035   °[facility]    (336) 832-7272   °[facility] [address] °[place], [place] [address] (336) 832-4777   °[facility] [address], °[place] (336) 271-4999   °[facility]    (336) 373-0678   °[facility]    (336) 272-1050   °[facility] ° [address], [place] Phone:  (336) 832-4444, Fax:  (336) 832-4440 Hours of Operation:  9 am - 6 pm, M-F.  Also accepts Medicaid/Medicare and self-pay.  °[facility] ° [address], [place] Phone: (336) 832-3150, Fax: (336) 832-3151. Hours of Operation:  8:30 am - 5:30 pm, M-F.  Also accepts Medicaid and self-pay.  °[facility] [place] [address], [place] Phone: (336) 878-6027   °[facility] [address], [place], [place] (336)723-1848, Ext. 123 Mondays & Thursdays: 7-9 AM.  First 15 patients are seen on a first come, first serve basis. °  ° °Medicaid-accepting [place] Providers: ° °Organization         Address  Phone   Notes  °[facility] [address], [place] (336) 641-2100 Also accepts self-pay patients.  °  [facility] [address], [place] ° (336) 856-9996   °[facility] [address], [place] (336) 288-8857   °[facility] [address] [place] [address], [place] (336) 299-7000   °Veita Bland [address], [place]  ° (336) 373-1557 Only accepts [place] Access Medicaid patients after they have their name applied to their card.  ° °Self-Pay (no insurance) in [place]: ° °Organization         Address  Phone   Notes  °Sickle Cell Patients, [facility] [address], [place] (336) 832-1970   °[facility] [address], [place] (336) 832-4400   °[facility] ° [address], [place] (336) 992-4800   °[facility]/Dr. Osei-Bonsu ° [address] [place] [address], [place] or  [address], [place] (336) 841-8500 Phone number for both [place] and [place] locations is the same.  °[facility] [address], [place] (336) 299-0000   °[facility] [address] [place] [address], [place] or [address] (336) 852-7530 °(336) 878-2260   °[facility] [address], [place] (336) 350-1642, phone; (336) 294-5005, fax Sees patients 1st and 3rd Saturday of every month.  Must not qualify for public or private insurance (i.e. Medicaid, Medicare, [place] Health Choice, Veterans' Benefits) • Household income should be no more than 200% of the poverty level •The clinic cannot treat you if you are pregnant or think you are pregnant • Sexually transmitted diseases are not treated at the clinic.  ° ° °Dental Care: °Organization         Address  Phone  Notes  °[facility] [facility] [address], [place] (336) 641-6152 Accepts children up to age 21 who are enrolled in Medicaid or [place] Health Choice; pregnant women with a Medicaid card; and children who have applied for Medicaid or [place] Health Choice, but were declined, whose parents can pay a reduced fee at time of service.  °[facility] [place]  [address], [place] (336) 641-7733 Accepts children up to age 21 who are enrolled in Medicaid or [place] Health Choice; pregnant women with a Medicaid card; and children who have applied for Medicaid or [place] Health Choice, but were declined, whose parents can pay a reduced fee at time of service.  °[facility] ° [address], [place] (336) 641-4533 Patients are seen by appointment only. Walk-ins are not accepted. [facility] will see patients 18 years of age and older. °Monday - Tuesday (8am-5pm) °Most Wednesdays (8:30-5pm) °$30 per visit, cash only  °[facility] ° [address], [place] (336) 641-4533 Patients are seen by appointment only. Walk-ins are not accepted. [facility] will see patients 18 years of age and older. °One Wednesday Evening (Monthly: Volunteer Based).  $30 per visit, cash only  °[facility]  (919) 537-3737 for adults; Children under age 4, call Graduate Pediatric Dentistry at (919) [phone]. Children aged 4-14, please call (919) [phone] to request a pediatric application. ° Dental services are provided in all areas of dental care including fillings, crowns and bridges, complete and partial dentures, implants, gum treatment, root canals, and extractions. Preventive care is also provided. Treatment is provided to both adults   and children. °Patients are selected via a lottery and there is often a waiting list. °  °[facility] [address], °[place] ° (336) 763-8833 www.drcivils.com °  °[facility] [address], [place], [place] (336)723-1848, Ext. 123 Second and Fourth Thursday of each month, opens at 6:30 AM; Clinic ends at 9 AM.  Patients are seen on a first-come first-served basis, and a limited number are seen during each clinic.  ° °[facility] ° [address], [place], [place] (336) 723-7904   Eligibility Requirements °You must have lived in [place], [place], or [place] counties for at least the last three months. °  You cannot be eligible for state or federal sponsored healthcare insurance, including Veterans Administration, Medicaid, or Medicare. °  You generally cannot be eligible for healthcare insurance through your employer.  °  How to apply: °Eligibility screenings are held every Tuesday and Wednesday afternoon from 1:00 pm until 4:00 pm. You do not need an appointment for the interview!  °[facility] [address], [place], [place] 336-631-2330   °[facility]  336-342-8273   °[facility]  336-703-3100   °[facility]  336-570-6415   ° °Behavioral Health Resources in the Community: °Intensive Outpatient Programs °Organization         Address  Phone  Notes  °[facility] [address], [place], [place] 336-878-6098   °[facility] [address], [place], [place] 336-832-9800   °[facility] [address], [place], [place] ° 336-882-2125   °[facility] [address],  °[place], [place] 1-800-853-5163 or 336-641-4981   °Substance Abuse Resources °Organization         Address  Phone  Notes  °[facility]  336-882-2125   °[facility]  336-784-9470   °[facility]  336-285-9073   °[facility]  336-845-3988   °Residential & Outpatient Substance Abuse Program  1-800-659-3381   °Psychological Services °Organization         Address  Phone  Notes  °[facility]  336- 832-9600   °[facility]  336- 378-7881   °[facility] [address], [place] 1-800-853-5163 or 336-641-4981   ° °Mobile Crisis Teams °Organization         Address  Phone  Notes  °[facility], Mobile Crisis Care Unit  1-877-626-1772   °Assertive °[facility] ° [address] [place], [place] 336-834-9664   °Sharon DeEsch [address] °[place] 336-554-5454   ° °Self-Help/Support Groups °Organization         Address  Phone             Notes  °[facility] - variety of support groups  336- 373-1402 Call for more information  °Narcotics Anonymous (NA), [facility] [address], °[place]  2 meetings at this location  ° °Residential Treatment Programs °Organization         Address  Phone  Notes  °[facility] [address],    °[place]  1-866-801-8205   °[facility] ° [address], [place], [place] 704-293-8524   °[facility] [address], [place] 336-845-3988  Admissions: 8am-3pm M-F  °[facility] [address]    °[place],  336-841-1104   °[facility] [address], [place], [place] 336-379-7146   °[facility] [address]  °[place], [place] 336-285-9073   °[facility] - Intensive Outpatient [address], [place], [place] 336-852-3033   °[facility] [address]  °[place], [place] 1-877-615-2722 or 336-784-9470   °[facility] [address] [place], [place] 336-227-7417 Accepts Medicaid  °[facility] [address]  °[place] 1-800-659-3381 Substance Abuse/Addiction Treatment  ° °[place] Behavioral Health Resources °Organization         Address  Phone  Notes  °[facility]  (888) 581-9988   °Julie Brannon, PhD [address] [place], [place]   (336) 349-5553 or (336) 951-0000   °[facility]   [address] °[place], [place] (336) 349-4454   °[facility] [address], [place], [place] (336) 342-8316 Insurance/Medicaid/sponsorship through [facility]  °[facility] [address]                                    [place], [place] (336) 342-8316 Therapy/tele-psych/case  °[facility] [address]  ° [place], [place] (336) 349-2233    °Dr. Arfeen  (336) 349-4544   °[facility] [place]  [employer] [place] [facility] 1) [address], [place] °2) [address], [place] °3)  [address], [place] (336) 349-3220 °(336) 342-7768 ° °(336) 342-8140   °[place] [facility] (336) 342-1394 or (336) 342-3537 (After Hours)    ° °  °

## 2013-11-22 NOTE — ED Notes (Signed)
Per pt sts she has noticed some reoccurring vaginal sores that are open wounds.

## 2013-11-22 NOTE — ED Notes (Signed)
Pt requests that blood work be drawn to check for herpes, pt made aware that RN will speak with PA in regards to pt wishes. PA at bedside.

## 2013-11-22 NOTE — ED Provider Notes (Signed)
CSN: [id number]     Arrival date & time 11/22/13  1478 History   First MD Initiated Contact with Patient 11/22/13 1102     Chief Complaint  Patient presents with  . Vaginal Itching   HPI Comments: Patient has a previous history of chlamydia, gonorrhea, trichomonas, and also BV.  She has been treated for all of the above infections.  She denies any sexual activity at this time and has had no new partners in the last six months.  Patient is complaining of white vaginal discharge and says "I get a yeast infection every time I get my period."  Her ObGyn out in [place] has given her a standing diflucan perscription which she uses almost monthly.    Patient is a 23 y.o. female presenting with vaginal itching. The history is provided by the patient. No language interpreter was used.  Vaginal Itching This is a new problem. The current episode started in the past 7 days. The problem occurs constantly. The problem has been unchanged. Associated symptoms include abdominal pain. Pertinent negatives include no anorexia, arthralgias, change in bowel habit, chest pain, chills, congestion, coughing, diaphoresis, fatigue, fever, headaches, joint swelling, myalgias, nausea, neck pain, numbness, rash, sore throat, swollen glands, urinary symptoms, vertigo, visual change, vomiting or weakness. Nothing aggravates the symptoms. She has tried nothing for the symptoms.    Past Medical History  Diagnosis Date  . Gonorrhea   . BV (bacterial vaginosis)   . Abnormal Pap smear 06/2011  . H/O candidiasis   . History of bacterial infection   . Yeast infection   . Pregnancy induced hypertension   . Chlamydia   . Bartholin cyst     w ward catheter   Past Surgical History  Procedure Laterality Date  . Wisdom tooth extraction    . Cesarean section  01/17/2012    Procedure: CESAREAN SECTION;  Surgeon: Esmeralda Arthur, MD;  Location: [facility];  Service: Obstetrics;  Laterality: N/A;  Priimary Cesarean Section Delivery Boy  @0044 , [place] 9/9   Family History  Problem Relation Age of Onset  . Cancer Maternal Aunt     Breast   . Cancer Maternal Aunt     Breast  . Hypertension Maternal Grandmother   . Other Neg Hx    History  Substance Use Topics  . Smoking status: Current Every Day Smoker -- 0.25 packs/day    Last Attempt to Quit: 05/25/2011  . Smokeless tobacco: Never Used     Comment: "I've quit because I can't tolerate the smell anymore."  . Alcohol Use: Yes   OB History   Grav Para Term Preterm Abortions TAB SAB Ect Mult Living   1 1 1  0 0 0 0 0 0 1     Review of Systems  Constitutional: Negative for fever, chills, diaphoresis and fatigue.  HENT: Negative for congestion and sore throat.   Respiratory: Negative for cough.   Cardiovascular: Negative for chest pain.  Gastrointestinal: Positive for abdominal pain. Negative for nausea, vomiting, anorexia and change in bowel habit.  Genitourinary: Positive for dysuria, urgency, frequency and vaginal discharge. Negative for hematuria, flank pain and vaginal pain.  Musculoskeletal: Negative for arthralgias, joint swelling, myalgias and neck pain.  Skin: Negative for rash.  Neurological: Negative for vertigo, weakness, numbness and headaches.  All other systems reviewed and are negative.     Allergies  Review of patient's allergies indicates no known allergies.  Home Medications   Prior to Admission medications   Medication Sig  Start Date End Date Taking? Authorizing Provider  fluconazole (DIFLUCAN) 150 MG tablet Take 1 tablet (150 mg total) by mouth daily. 11/22/13 11/29/13  Courtney A Forucci, PA-C  metroNIDAZOLE (METROGEL VAGINAL) 0.75 % vaginal gel Place 1 Applicatorful vaginally 2 (two) times daily. 11/22/13 11/27/13  Courtney A Forucci, PA-C   BP 120/54  Pulse 81  Temp(Src) 97.6 F (36.4 C) (Oral)  Resp 16  Ht 5\' 7"  (1.702 m)  Wt 195 lb 11.2 oz (88.769 kg)  BMI 30.64 kg/m2  SpO2 97%  LMP 11/16/2013 Physical Exam  Nursing note and  vitals reviewed. Constitutional: She is oriented to person, place, and time. She appears well-developed and well-nourished. No distress.  HENT:  Head: Normocephalic and atraumatic.  Mouth/Throat: Oropharynx is clear and moist. No oropharyngeal exudate.  Eyes: Conjunctivae are normal. Pupils are equal, round, and reactive to light. No scleral icterus.  Neck: Normal range of motion. Neck supple. No JVD present. No thyromegaly present.  Cardiovascular: Normal rate, regular rhythm, normal heart sounds and intact distal pulses.  Exam reveals no gallop and no friction rub.   No murmur heard. Pulmonary/Chest: Effort normal and breath sounds normal. No respiratory distress. She has no wheezes. She has no rales. She exhibits no tenderness.  Abdominal: Soft. Normal appearance and bowel sounds are normal. She exhibits no distension and no mass. There is no tenderness. There is no rebound, no guarding and no CVA tenderness.  Genitourinary: Uterus normal. There is no rash, tenderness, lesion or injury on the right labia. There is no rash, tenderness, lesion or injury on the left labia. Cervix exhibits discharge. Cervix exhibits no motion tenderness and no friability. Right adnexum displays no mass, no tenderness and no fullness. Left adnexum displays no mass, no tenderness and no fullness. No erythema, tenderness or bleeding around the vagina. No foreign body around the vagina. No signs of injury around the vagina. Vaginal discharge found.  Scant amounts of thin white discharge.  Negative whiff test  Musculoskeletal: Normal range of motion.  Lymphadenopathy:    She has no cervical adenopathy.  Neurological: She is alert and oriented to person, place, and time.  Skin: Skin is warm and dry. She is not diaphoretic.  1 small papule on the left labia without erythema, induration, or drainage.  Psychiatric: She has a normal mood and affect. Her behavior is normal. Judgment and thought content normal.    ED Course   Procedures (including critical care time) Labs Review Labs Reviewed  WET PREP, GENITAL - Abnormal; Notable for the following:    Clue Cells Wet Prep HPF POC FEW (*)    WBC, Wet Prep HPF POC FEW (*)    All other components within normal limits  GC/CHLAMYDIA PROBE AMP  URINALYSIS, ROUTINE W REFLEX MICROSCOPIC  PREGNANCY, URINE  RPR  HIV ANTIBODY (ROUTINE TESTING)    Imaging Review No results found.   EKG Interpretation None      MDM   Final diagnoses:  Bacterial vaginosis   Patient presents to the ED with vaginal itching, burning, and few bumps near the introitus.  UA and urine pregnancy are negative.  GC, RPR, and HIV are currently pending.  Wet prep is negative for yeast and trichomonas, but positive for clue cells and WBCs.  Will treat for BV at this time.  I will also give a referral for ObGyn for follow-up.  No signs of genital herpes at this time.  Think that bumps are likely folliculitis.  Will prescribe metrogel for BV  treatment.  Patient has requested diflucan as she usually gets yeast infection after metrogel treatment.  I will give one diflucan.  I have also given her the resource list at this time.  She states understanding and agreement to the above plan.  She was given strict return precautions of worsening abdominal pain, fever, or any other concerning symptoms to return to the ED.      Clydie Braunourtney A Forucci, PA-C 11/22/13 1404

## 2013-11-22 NOTE — ED Provider Notes (Signed)
Medical screening examination/treatment/procedure(s) were performed by non-physician practitioner and as supervising physician I was immediately available for consultation/collaboration.   EKG Interpretation None       Stephen Kohut, MD 11/22/13 1748 

## 2013-11-23 LAB — GC/CHLAMYDIA PROBE AMP
CT Probe RNA: NEGATIVE
GC Probe RNA: NEGATIVE

## 2013-11-23 LAB — HIV ANTIBODY (ROUTINE TESTING W REFLEX): HIV 1&2 Ab, 4th Generation: NONREACTIVE

## 2014-01-03 ENCOUNTER — Emergency Department (HOSPITAL_COMMUNITY)
Admission: EM | Admit: 2014-01-03 | Discharge: 2014-01-03 | Disposition: A | Discharge status: Home / Self Care | Payer: BC Managed Care – PPO | Attending: Emergency Medicine | Admitting: Emergency Medicine

## 2014-01-03 ENCOUNTER — Encounter (HOSPITAL_COMMUNITY): Payer: Self-pay | Admitting: Emergency Medicine

## 2014-01-03 DIAGNOSIS — B373 Candidiasis of vulva and vagina: Secondary | ICD-10-CM | POA: Insufficient documentation

## 2014-01-03 DIAGNOSIS — N898 Other specified noninflammatory disorders of vagina: Secondary | ICD-10-CM | POA: Insufficient documentation

## 2014-01-03 DIAGNOSIS — H109 Unspecified conjunctivitis: Secondary | ICD-10-CM

## 2014-01-03 DIAGNOSIS — F17200 Nicotine dependence, unspecified, uncomplicated: Secondary | ICD-10-CM | POA: Insufficient documentation

## 2014-01-03 DIAGNOSIS — B379 Candidiasis, unspecified: Secondary | ICD-10-CM

## 2014-01-03 DIAGNOSIS — B3731 Acute candidiasis of vulva and vagina: Secondary | ICD-10-CM | POA: Insufficient documentation

## 2014-01-03 DIAGNOSIS — Z3202 Encounter for pregnancy test, result negative: Secondary | ICD-10-CM | POA: Insufficient documentation

## 2014-01-03 LAB — URINALYSIS, ROUTINE W REFLEX MICROSCOPIC
BILIRUBIN URINE: NEGATIVE
GLUCOSE, UA: NEGATIVE mg/dL
HGB URINE DIPSTICK: NEGATIVE
Ketones, ur: NEGATIVE mg/dL
Leukocytes, UA: NEGATIVE
Nitrite: NEGATIVE
PH: 6.5 (ref 5.0–8.0)
Protein, ur: NEGATIVE mg/dL
SPECIFIC GRAVITY, URINE: 1.027 (ref 1.005–1.030)
Urobilinogen, UA: 1 mg/dL (ref 0.0–1.0)

## 2014-01-03 LAB — PREGNANCY, URINE: PREG TEST UR: NEGATIVE

## 2014-01-03 LAB — WET PREP, GENITAL
Clue Cells Wet Prep HPF POC: NONE SEEN
TRICH WET PREP: NONE SEEN
YEAST WET PREP: NONE SEEN

## 2014-01-03 MED ORDER — FLUCONAZOLE 150 MG PO TABS: ORAL_TABLET | ORAL | Status: DC

## 2014-01-03 MED ORDER — FLUORESCEIN SODIUM 1 MG OP STRP
1.0000 | ORAL_STRIP | Freq: Once | OPHTHALMIC | Status: AC
  Administered 2014-01-03: 1 via OPHTHALMIC
  Filled 2014-01-03: qty 1

## 2014-01-03 MED ORDER — TETRACAINE HCL 0.5 % OP SOLN
1.0000 [drp] | Freq: Once | OPHTHALMIC | Status: AC
  Administered 2014-01-03: 1 [drp] via OPHTHALMIC
  Filled 2014-01-03: qty 2

## 2014-01-03 MED ORDER — POLYMYXIN B-TRIMETHOPRIM 10000-0.1 UNIT/ML-% OP SOLN: 1.0000 [drp] | OPHTHALMIC | Status: DC

## 2014-01-03 NOTE — Discharge Instructions (Signed)
Conjunctivitis Conjunctivitis is commonly called "pink eye." Conjunctivitis can be caused by bacterial or viral infection, allergies, or injuries. There is usually redness of the lining of the eye, itching, discomfort, and sometimes discharge. There may be deposits of matter along the eyelids. A viral infection usually causes a watery discharge, while a bacterial infection causes a yellowish, thick discharge. Pink eye is very contagious and spreads by direct contact. You may be given antibiotic eyedrops as part of your treatment. Before using your eye medicine, remove all drainage from the eye by washing gently with warm water and cotton balls. Continue to use the medication until you have awakened 2 mornings in a row without discharge from the eye. Do not rub your eye. This increases the irritation and helps spread infection. Use separate towels from other household members. Wash your hands with soap and water before and after touching your eyes. Use cold compresses to reduce pain and sunglasses to relieve irritation from light. Do not wear contact lenses or wear eye makeup until the infection is gone. SEEK MEDICAL CARE IF:   Your symptoms are not better after 3 days of treatment.  You have increased pain or trouble seeing.  The outer eyelids become very red or swollen. Document Released: 06/21/2004 Document Revised: 08/06/2011 Document Reviewed: 05/14/2005 [facility] Patient Information 2015 [place]ExitCare, [place]. This information is not intended to replace advice given to you by your health care provider. Make sure you discuss any questions you have with your health care provider. Yeast Infection of the Skin Some yeast on the skin is normal, but sometimes it causes an infection. If you have a yeast infection, it shows up as white or light brown patches on brown skin. You can see it better in the summer on tan skin. It causes light-colored holes in your suntan. It can happen on any area of the body. This  cannot be passed from person to person. HOME CARE  Scrub your skin daily with a dandruff shampoo. Your rash may take a couple weeks to get well.  Do not scratch or itch the rash. GET HELP RIGHT AWAY IF:   You get another infection from scratching. The skin may get warm, red, and may ooze fluid.  The infection does not seem to be getting better. MAKE SURE YOU:  Understand these instructions.  Will watch your condition.  Will get help right away if you are not doing well or get worse. Document Released: 04/26/2008 Document Revised: 08/06/2011 Document Reviewed: 04/26/2008 [facility] Patient Information 2015 [place]ExitCare, [place]LL[place]. This information is not intended to replace advice given to you by your health care provider. Make sure you discuss any questions you have with your health care provider.

## 2014-01-03 NOTE — ED Notes (Signed)
Pt. reports vaginal discharge and itching onset 3 days ago  , denies fever or chills.

## 2014-01-03 NOTE — ED Provider Notes (Signed)
Medical screening examination/treatment/procedure(s) were performed by non-physician practitioner and as supervising physician I was immediately available for consultation/collaboration.   EKG Interpretation None        Shon Batonourtney F Horton, MD 01/03/14 [phone]

## 2014-01-03 NOTE — ED Provider Notes (Signed)
CSN: [id number]     Arrival date & time 01/03/14  0011 History   First MD Initiated Contact with Patient 01/03/14 0032     Chief Complaint  Patient presents with  . Vaginal Discharge     (Consider location/radiation/quality/duration/timing/severity/associated sxs/prior Treatment) HPI Comments: Patient presents to the emergency department with chief complaint of vaginal discharge and itching for the past 3 days. She states that she normally has these symptoms following her menstrual cycle. She denies any lower abdominal pain. Denies any dysuria. She denies any fevers, chills, nausea, vomiting, diarrhea, or constipation. There no aggravating or alleviating factors.  Additionally, she thinks that she got something in her eye, might have scratched it. She reports increased lacrimation of the left eye with minor irritation. There no aggravating or alleviating factors.  Denies any change in vision.  The history is provided by the patient. No language interpreter was used.    Past Medical History  Diagnosis Date  . Gonorrhea   . BV (bacterial vaginosis)   . Abnormal Pap smear 06/2011  . H/O candidiasis   . History of bacterial infection   . Yeast infection   . Pregnancy induced hypertension   . Chlamydia   . Bartholin cyst     w ward catheter   Past Surgical History  Procedure Laterality Date  . Wisdom tooth extraction    . Cesarean section  01/17/2012    Procedure: CESAREAN SECTION;  Surgeon: Esmeralda Arthur, MD;  Location: [facility];  Service: Obstetrics;  Laterality: N/A;  Priimary Cesarean Section Delivery Boy @0044 , Apgars 9/9   Family History  Problem Relation Age of Onset  . Cancer Maternal Aunt     Breast   . Cancer Maternal Aunt     Breast  . Hypertension Maternal Grandmother   . Other Neg Hx    History  Substance Use Topics  . Smoking status: Current Every Day Smoker -- 0.25 packs/day    Last Attempt to Quit: 05/25/2011  . Smokeless tobacco: Never Used     Comment:  "I've quit because I can't tolerate the smell anymore."  . Alcohol Use: Yes   OB History   Grav Para Term Preterm Abortions TAB SAB Ect Mult Living   1 1 1  0 0 0 0 0 0 1     Review of Systems  Constitutional: Negative for fever and chills.  Respiratory: Negative for shortness of breath.   Cardiovascular: Negative for chest pain.  Gastrointestinal: Negative for nausea, vomiting, diarrhea and constipation.  Genitourinary: Positive for vaginal discharge. Negative for dysuria.  All other systems reviewed and are negative.     Allergies  Review of patient's allergies indicates no known allergies.  Home Medications   Prior to Admission medications   Not on File   BP 116/75  Pulse 108  Temp(Src) 98.5 F (36.9 C) (Oral)  Resp 14  Ht 5\' 6"  (1.676 m)  Wt 194 lb (87.998 kg)  BMI 31.33 kg/m2  SpO2 99%  LMP 12/15/2013 Physical Exam  Nursing note and vitals reviewed. Constitutional: She is oriented to person, place, and time. She appears well-developed and well-nourished.  HENT:  Head: Normocephalic and atraumatic.  Eyes: Conjunctivae and EOM are normal. Pupils are equal, round, and reactive to light.  No retained foreign body, no evidence of corneal abrasion with fluorescein exam, minimal conjunctival irritation  Neck: Normal range of motion. Neck supple.  Cardiovascular: Normal rate and regular rhythm.  Exam reveals no gallop and no friction rub.  No murmur heard. Pulmonary/Chest: Effort normal and breath sounds normal. No respiratory distress. She has no wheezes. She has no rales. She exhibits no tenderness.  Abdominal: Soft. Bowel sounds are normal. She exhibits no distension and no mass. There is no tenderness. There is no rebound and no guarding.  Genitourinary:  Pelvic exam chaperoned by female ER tech, no right or left adnexal tenderness, no uterine tenderness, moderate vaginal discharge, no bleeding, no CMT or friability, no foreign body, no injury to the external  genitalia, no other significant findings  Findings consistent with yeast infection   Musculoskeletal: Normal range of motion. She exhibits no edema and no tenderness.  Neurological: She is alert and oriented to person, place, and time.  Skin: Skin is warm and dry.  Psychiatric: She has a normal mood and affect. Her behavior is normal. Judgment and thought content normal.    ED Course  Procedures (including critical care time) Labs Review Labs Reviewed  WET PREP, GENITAL  GC/CHLAMYDIA PROBE AMP  URINALYSIS, ROUTINE W REFLEX MICROSCOPIC  PREGNANCY, URINE    Imaging Review No results found.   EKG Interpretation None      MDM   Final diagnoses:  Yeast infection  Conjunctivitis of left eye    Patient with vaginal discharge.  No abdominal pain.  States that she gets frequent yeast infections.  Will check UA, urine preg, and wet prep.  No evidence of corneal abrasion, or retained foreign body. No vision changes. However patient does complain of some eye irritation discharge. Will discharge with some eyedrops.    Roxy Horsemanobert Jessup Ogas, PA-C 01/03/14 [phone]

## 2014-01-05 LAB — GC/CHLAMYDIA PROBE AMP
CT Probe RNA: NEGATIVE
GC Probe RNA: NEGATIVE

## 2014-03-02 ENCOUNTER — Encounter (HOSPITAL_COMMUNITY): Payer: Self-pay | Admitting: Emergency Medicine

## 2014-03-02 ENCOUNTER — Emergency Department (HOSPITAL_COMMUNITY)
Admission: EM | Admit: 2014-03-02 | Discharge: 2014-03-02 | Disposition: A | Discharge status: Home / Self Care | Payer: BC Managed Care – PPO | Attending: Emergency Medicine | Admitting: Emergency Medicine

## 2014-03-02 DIAGNOSIS — N760 Acute vaginitis: Secondary | ICD-10-CM | POA: Insufficient documentation

## 2014-03-02 DIAGNOSIS — Z3202 Encounter for pregnancy test, result negative: Secondary | ICD-10-CM | POA: Insufficient documentation

## 2014-03-02 DIAGNOSIS — B9689 Other specified bacterial agents as the cause of diseases classified elsewhere: Secondary | ICD-10-CM

## 2014-03-02 DIAGNOSIS — R112 Nausea with vomiting, unspecified: Secondary | ICD-10-CM | POA: Insufficient documentation

## 2014-03-02 DIAGNOSIS — Z720 Tobacco use: Secondary | ICD-10-CM | POA: Insufficient documentation

## 2014-03-02 DIAGNOSIS — N898 Other specified noninflammatory disorders of vagina: Secondary | ICD-10-CM | POA: Diagnosis present

## 2014-03-02 LAB — COMPREHENSIVE METABOLIC PANEL
ALBUMIN: 4 g/dL (ref 3.5–5.2)
ALK PHOS: 64 U/L (ref 39–117)
ALT: 19 U/L (ref 0–35)
ANION GAP: 13 (ref 5–15)
AST: 17 U/L (ref 0–37)
BUN: 12 mg/dL (ref 6–23)
CALCIUM: 9.3 mg/dL (ref 8.4–10.5)
CO2: 23 mEq/L (ref 19–32)
Chloride: 104 mEq/L (ref 96–112)
Creatinine, Ser: 0.73 mg/dL (ref 0.50–1.10)
GFR calc non Af Amer: >90 mL/min (ref >90)
GLUCOSE: 92 mg/dL (ref 70–99)
POTASSIUM: 3.9 meq/L (ref 3.7–5.3)
Sodium: 140 mEq/L (ref 137–147)
TOTAL PROTEIN: 7.9 g/dL (ref 6.0–8.3)
Total Bilirubin: <0.2 mg/dL — ABNORMAL LOW (ref 0.3–1.2)

## 2014-03-02 LAB — CBC WITH DIFFERENTIAL/PLATELET
BASOS PCT: 0 % (ref 0–1)
Basophils Absolute: 0 10*3/uL (ref 0.0–0.1)
EOS ABS: 0.1 10*3/uL (ref 0.0–0.7)
EOS PCT: 1 % (ref 0–5)
HCT: 42.1 % (ref 36.0–46.0)
Hemoglobin: 14.2 g/dL (ref 12.0–15.0)
Lymphocytes Relative: 12 % (ref 12–46)
Lymphs Abs: 1 10*3/uL (ref 0.7–4.0)
MCH: 27.5 pg (ref 26.0–34.0)
MCHC: 33.7 g/dL (ref 30.0–36.0)
MCV: 81.4 fL (ref 78.0–100.0)
MONOS PCT: 8 % (ref 3–12)
Monocytes Absolute: 0.7 10*3/uL (ref 0.1–1.0)
NEUTROS PCT: 79 % — AB (ref 43–77)
Neutro Abs: 7 10*3/uL (ref 1.7–7.7)
Platelets: 337 10*3/uL (ref 150–400)
RBC: 5.17 MIL/uL — ABNORMAL HIGH (ref 3.87–5.11)
RDW: 13.1 % (ref 11.5–15.5)
WBC: 8.8 10*3/uL (ref 4.0–10.5)

## 2014-03-02 LAB — URINALYSIS, ROUTINE W REFLEX MICROSCOPIC
Bilirubin Urine: NEGATIVE
Glucose, UA: NEGATIVE mg/dL
Hgb urine dipstick: NEGATIVE
Ketones, ur: 40 mg/dL — AB
LEUKOCYTES UA: NEGATIVE
NITRITE: NEGATIVE
PROTEIN: NEGATIVE mg/dL
SPECIFIC GRAVITY, URINE: 1.029 (ref 1.005–1.030)
UROBILINOGEN UA: 0.2 mg/dL (ref 0.0–1.0)
pH: 5 (ref 5.0–8.0)

## 2014-03-02 LAB — WET PREP, GENITAL
Trich, Wet Prep: NONE SEEN
Yeast Wet Prep HPF POC: NONE SEEN

## 2014-03-02 LAB — POC URINE PREG, ED: Preg Test, Ur: NEGATIVE

## 2014-03-02 MED ORDER — ONDANSETRON 4 MG PO TBDP
4.0000 mg | ORAL_TABLET | Freq: Once | ORAL | Status: AC
  Administered 2014-03-02: 4 mg via ORAL
  Filled 2014-03-02: qty 1

## 2014-03-02 MED ORDER — METRONIDAZOLE 500 MG PO TABS: 500.0000 mg | ORAL_TABLET | Freq: Two times a day (BID) | ORAL | Status: DC

## 2014-03-02 MED ORDER — ONDANSETRON HCL 4 MG PO TABS: 4.0000 mg | ORAL_TABLET | Freq: Four times a day (QID) | ORAL | Status: DC

## 2014-03-02 MED ORDER — HYDROCODONE-ACETAMINOPHEN 5-325 MG PO TABS: 1.0000 | ORAL_TABLET | ORAL | Status: DC | PRN

## 2014-03-02 MED ORDER — ONDANSETRON 4 MG PO TBDP
8.0000 mg | ORAL_TABLET | Freq: Once | ORAL | Status: AC
  Administered 2014-03-02: 8 mg via ORAL
  Filled 2014-03-02: qty 2

## 2014-03-02 MED ORDER — FLUCONAZOLE 200 MG PO TABS: 200.0000 mg | ORAL_TABLET | Freq: Every day | ORAL | Status: AC

## 2014-03-02 NOTE — Discharge Instructions (Signed)
Bacterial Vaginosis Bacterial vaginosis is a vaginal infection that occurs when the normal balance of bacteria in the vagina is disrupted. It results from an overgrowth of certain bacteria. This is the most common vaginal infection in women of childbearing age. Treatment is important to prevent complications, especially in pregnant women, as it can cause a premature delivery. CAUSES  Bacterial vaginosis is caused by an increase in harmful bacteria that are normally present in smaller amounts in the vagina. Several different kinds of bacteria can cause bacterial vaginosis. However, the reason that the condition develops is not fully understood. RISK FACTORS Certain activities or behaviors can put you at an increased risk of developing bacterial vaginosis, including:  Having a new sex partner or multiple sex partners.  Douching.  Using an intrauterine device (IUD) for contraception. Women do not get bacterial vaginosis from toilet seats, bedding, swimming pools, or contact with objects around them. SIGNS AND SYMPTOMS  Some women with bacterial vaginosis have no signs or symptoms. Common symptoms include:  Grey vaginal discharge.  A fishlike odor with discharge, especially after sexual intercourse.  Itching or burning of the vagina and vulva.  Burning or pain with urination. DIAGNOSIS  Your health care provider will take a medical history and examine the vagina for signs of bacterial vaginosis. A sample of vaginal fluid may be taken. Your health care provider will look at this sample under a microscope to check for bacteria and abnormal cells. A vaginal pH test may also be done.  TREATMENT  Bacterial vaginosis may be treated with antibiotic medicines. These may be given in the form of a pill or a vaginal cream. A second round of antibiotics may be prescribed if the condition comes back after treatment.  HOME CARE INSTRUCTIONS   Only take over-the-counter or prescription medicines as  directed by your health care provider.  If antibiotic medicine was prescribed, take it as directed. Make sure you finish it even if you start to feel better.  Do not have sex until treatment is completed.  Tell all sexual partners that you have a vaginal infection. They should see their health care provider and be treated if they have problems, such as a mild rash or itching.  Practice safe sex by using condoms and only having one sex partner. SEEK MEDICAL CARE IF:   Your symptoms are not improving after 3 days of treatment.  You have increased discharge or pain.  You have a fever. MAKE SURE YOU:   Understand these instructions.  Will watch your condition.  Will get help right away if you are not doing well or get worse. FOR MORE INFORMATION  Centers for Disease Control and Prevention, Division of STD Prevention: SolutionApps.co.zawww.cdc.gov/std American Sexual Health Association (ASHA): www.ashastd.org  Document Released: 05/14/2005 Document Revised: 03/04/2013 Document Reviewed: 12/24/2012 [facility] WayExitCare Patient Information 2015 [place]ExitCare, [place]LLC. This information is not intended to replace advice given to you by your health care provider. Make sure you discuss any questions you have with your health care provider.  Nausea and Vomiting Nausea is a sick feeling that often comes before throwing up (vomiting). Vomiting is a reflex where stomach contents come out of your mouth. Vomiting can cause severe loss of body fluids (dehydration). Children and elderly adults can become dehydrated quickly, especially if they also have diarrhea. Nausea and vomiting are symptoms of a condition or disease. It is important to find the cause of your symptoms. CAUSES   Direct irritation of the stomach lining. This irritation can result from  if they also have diarrhea. Nausea and vomiting are symptoms of a condition or disease. It is important to find the cause of your symptoms.  CAUSES    Direct irritation of the stomach lining. This irritation can result from increased acid production (gastroesophageal reflux disease), infection, food poisoning, taking certain medicines (such as nonsteroidal anti-inflammatory drugs), alcohol use, or tobacco  use.   Signals from the brain.These signals could be caused by a headache, heat exposure, an inner ear disturbance, increased pressure in the brain from injury, infection, a tumor, or a concussion, pain, emotional stimulus, or metabolic problems.   An obstruction in the gastrointestinal tract (bowel obstruction).   Illnesses such as diabetes, hepatitis, gallbladder problems, appendicitis, kidney problems, cancer, sepsis, atypical symptoms of a heart attack, or eating disorders.   Medical treatments such as chemotherapy and radiation.   Receiving medicine that makes you sleep (general anesthetic) during surgery.  DIAGNOSIS  Your caregiver may ask for tests to be done if the problems do not improve after a few days. Tests may also be done if symptoms are severe or if the reason for the nausea and vomiting is not clear. Tests may include:   Urine tests.   Blood tests.   Stool tests.   Cultures (to look for evidence of infection).   X-rays or other imaging studies.  Test results can help your caregiver make decisions about treatment or the need for additional tests.  TREATMENT  You need to stay well hydrated. Drink frequently but in small amounts.You may wish to drink water, sports drinks, clear broth, or eat frozen ice pops or gelatin dessert to help stay hydrated.When you eat, eating slowly may help prevent nausea.There are also some antinausea medicines that may help prevent nausea.  HOME CARE INSTRUCTIONS    Take all medicine as directed by your caregiver.   If you do not have an appetite, do not force yourself to eat. However, you must continue to drink fluids.   If you have an appetite, eat a normal diet unless your caregiver tells you differently.   Eat a variety of complex carbohydrates (rice, wheat, potatoes, bread), lean meats, yogurt, fruits, and vegetables.   Avoid high-fat foods because they are more difficult to digest.   Drink enough water and fluids to keep your urine clear or pale  yellow.   If you are dehydrated, ask your caregiver for specific rehydration instructions. Signs of dehydration may include:   Severe thirst.   Dry lips and mouth.   Dizziness.   Dark urine.   Decreasing urine frequency and amount.   Confusion.   Rapid breathing or pulse.  SEEK IMMEDIATE MEDICAL CARE IF:    You have blood or brown flecks (like coffee grounds) in your vomit.   You have black or bloody stools.   You have a severe headache or stiff neck.   You are confused.   You have severe abdominal pain.   You have chest pain or trouble breathing.   You do not urinate at least once every 8 hours.   You develop cold or clammy skin.   You continue to vomit for longer than 24 to 48 hours.   You have a fever.  MAKE SURE YOU:    Understand these instructions.   Will watch your condition.   Will get help right away if you are not doing well or get worse.  Document Released: 05/14/2005 Document Revised: 08/06/2011 Document Reviewed: 10/11/2010  ExitCare Patient Information 2015 ExitCare, LLC.

## 2014-03-02 NOTE — ED Notes (Signed)
Pt states yesterday felt nauseated and then started vomiting today.  Pt states movement and eating triggers vomiting.  Pt thinks that she is getting a vaginal bacterial infection.  Vaginal discharge is thick and white.  Pt reports some lower abdominal pain.  Pt reports some constipation now and diarrhea earlier

## 2014-03-02 NOTE — ED Provider Notes (Signed)
CSN: [id number]     Arrival date & time 03/02/14  1703 History   First MD Initiated Contact with Patient 03/02/14 1959     Chief Complaint  Patient presents with  . Emesis  . Vaginal Discharge     (Consider location/radiation/quality/duration/timing/severity/associated sxs/prior Treatment) HPI  Patient to the ER with complaints of nausea, vomiting. She says that movement and eating makes her vomiting worse. She's also complaining of thick vaginal discharge that is white. She has history of bacterial infection reports that this feels the same. She's also having some lower abdominal pain. She denies having any fevers, abdominal bleeding, dysuria, hematuria.  Past Medical History  Diagnosis Date  . Gonorrhea   . BV (bacterial vaginosis)   . Abnormal Pap smear 06/2011  . H/O candidiasis   . History of bacterial infection   . Yeast infection   . Pregnancy induced hypertension   . Chlamydia   . Bartholin cyst     w ward catheter   Past Surgical History  Procedure Laterality Date  . Wisdom tooth extraction    . Cesarean section  01/17/2012    Procedure: CESAREAN SECTION;  Surgeon: Esmeralda ArthurSandra A Rivard, MD;  Location: [facility];  Service: Obstetrics;  Laterality: N/A;  Priimary Cesarean Section Delivery Boy @0044 , Apgars 9/9   Family History  Problem Relation Age of Onset  . Cancer Maternal Aunt     Breast   . Cancer Maternal Aunt     Breast  . Hypertension Maternal Grandmother   . Other Neg Hx    History  Substance Use Topics  . Smoking status: Current Every Day Smoker -- 0.25 packs/day    Last Attempt to Quit: 05/25/2011  . Smokeless tobacco: Never Used     Comment: "I've quit because I can't tolerate the smell anymore."  . Alcohol Use: Yes   OB History   Grav Para Term Preterm Abortions TAB SAB Ect Mult Living   1 1 1  0 0 0 0 0 0 1     Review of Systems  All other systems reviewed and are negative.     Allergies  Review of patient's allergies indicates no known  allergies.  Home Medications   Prior to Admission medications   Medication Sig Start Date End Date Taking? Authorizing Provider  fluconazole (DIFLUCAN) 200 MG tablet Take 1 tablet (200 mg total) by mouth daily. 03/02/14 03/09/14  Que Meneely Irine SealG Barbarann Kelly, PA-C  HYDROcodone-acetaminophen (NORCO/VICODIN) 5-325 MG per tablet Take 1-2 tablets by mouth every 4 (four) hours as needed. 03/02/14   Ryleigh Buenger Irine SealG Kasen Sako, PA-C  metroNIDAZOLE (FLAGYL) 500 MG tablet Take 1 tablet (500 mg total) by mouth 2 (two) times daily. 03/02/14   Demone Lyles Irine SealG Frankee Gritz, PA-C  ondansetron (ZOFRAN) 4 MG tablet Take 1 tablet (4 mg total) by mouth every 6 (six) hours. 03/02/14   Anayelli Lai Irine SealG Pansey Pinheiro, PA-C   BP 99/64  Pulse 95  Temp(Src) 98.7 F (37.1 C) (Oral)  Resp 16  Ht 5\' 7"  (1.702 m)  Wt 194 lb (87.998 kg)  BMI 30.38 kg/m2  SpO2 100%  LMP 02/07/2014 Physical Exam  Nursing note and vitals reviewed. Constitutional: She appears well-developed and well-nourished. No distress.  HENT:  Head: Normocephalic and atraumatic.  Eyes: Pupils are equal, round, and reactive to light.  Neck: Normal range of motion. Neck supple.  Cardiovascular: Normal rate and regular rhythm.   Pulmonary/Chest: Effort normal.  Abdominal: Soft. Bowel sounds are normal. There is tenderness in the suprapubic area. There is no  rebound and no guarding.  Genitourinary: Uterus normal. Cervix exhibits no motion tenderness, no discharge and no friability. Right adnexum displays no mass and no tenderness. Left adnexum displays no mass and no tenderness. No erythema around the vagina. No foreign body around the vagina. Vaginal discharge found.  Neurological: She is alert.  Skin: Skin is warm and dry.    ED Course  Procedures (including critical care time) Labs Review Labs Reviewed  WET PREP, GENITAL - Abnormal; Notable for the following:    Clue Cells Wet Prep HPF POC FEW (*)    WBC, Wet Prep HPF POC FEW (*)    All other components within normal limits  CBC WITH  DIFFERENTIAL - Abnormal; Notable for the following:    RBC 5.17 (*)    Neutrophils Relative % 79 (*)    All other components within normal limits  COMPREHENSIVE METABOLIC PANEL - Abnormal; Notable for the following:    Total Bilirubin <0.2 (*)    All other components within normal limits  URINALYSIS, ROUTINE W REFLEX MICROSCOPIC - Abnormal; Notable for the following:    APPearance CLOUDY (*)    Ketones, ur 40 (*)    All other components within normal limits  GC/CHLAMYDIA PROBE AMP  POC URINE PREG, ED    Imaging Review No results found.   EKG Interpretation None      MDM   Final diagnoses:  Non-intractable vomiting with nausea, vomiting of unspecified type  BV (bacterial vaginosis)    Small amount of discharge noted on exam. She has few clue cells. Reports being in monogamous relationship and using protection. Prefers to wait for cultures before being tested at this time. Will Rx with Flagyl and pain medications.  She has had some nausea and diarrhea. Her pain does not localize to a quadrant and she is afebrile with reassuring labs. Do not feel that diverticulitis or appendicitis is likely. I had a long discussion with the patient that if symptoms were to change or worsen, if her pain were to lateralize and localize that she would need to return for further evaluation immediately. She passed fluid challenge in the ED after having antiemetic medications  Medications  ondansetron (ZOFRAN-ODT) disintegrating tablet 8 mg (8 mg Oral Given 03/02/14 1758)  ondansetron (ZOFRAN-ODT) disintegrating tablet 4 mg (4 mg Oral Given 03/02/14 2132)     At this time so the patient is swollen up to go home and try outpatient therapy. 23 y.o.Margaurite E Luera's evaluation in the Emergency Department is complete. It has been determined that no acute conditions requiring further emergency intervention are present at this time. The patient/guardian have been advised of the diagnosis and plan. We have  discussed signs and symptoms that warrant return to the ED, such as changes or worsening in symptoms.  Vital signs are stable at discharge. Filed Vitals:   03/02/14 2215  BP: 99/64  Pulse: 95  Temp:   Resp:     Patient/guardian has voiced understanding and agreed to follow-up with the PCP or specialist.     Dorthula Matas, PA-C 03/04/14 0012

## 2014-03-03 LAB — GC/CHLAMYDIA PROBE AMP
CT PROBE, AMP APTIMA: NEGATIVE
GC PROBE AMP APTIMA: NEGATIVE

## 2014-03-04 NOTE — ED Provider Notes (Signed)
Medical screening examination/treatment/procedure(s) were performed by non-physician practitioner and as supervising physician I was immediately available for consultation/collaboration.   EKG Interpretation None        Purvis SheffieldForrest Tahir Blank, MD 03/04/14 1631

## 2014-03-29 ENCOUNTER — Encounter (HOSPITAL_COMMUNITY): Payer: Self-pay | Admitting: Emergency Medicine

## 2014-04-27 ENCOUNTER — Encounter (HOSPITAL_COMMUNITY): Payer: Self-pay | Admitting: Emergency Medicine

## 2014-04-27 ENCOUNTER — Emergency Department (HOSPITAL_COMMUNITY)
Admission: EM | Admit: 2014-04-27 | Discharge: 2014-04-27 | Discharge status: Against Medical Advice | Payer: BC Managed Care – PPO | Attending: Emergency Medicine | Admitting: Emergency Medicine

## 2014-04-27 DIAGNOSIS — Z720 Tobacco use: Secondary | ICD-10-CM | POA: Insufficient documentation

## 2014-04-27 DIAGNOSIS — R6884 Jaw pain: Secondary | ICD-10-CM | POA: Diagnosis not present

## 2014-04-27 NOTE — ED Notes (Signed)
Pt c/o pain in left jaw and neck x's 1 week.  St's painful to turn head.  Pt also st's she was on her period for 12 days and continues to have abd cramping.

## 2014-05-05 ENCOUNTER — Other Ambulatory Visit (HOSPITAL_COMMUNITY)
Admission: RE | Admit: 2014-05-05 | Discharge: 2014-05-05 | Disposition: A | Discharge status: Home / Self Care | Payer: BC Managed Care – PPO | Source: Ambulatory Visit | Attending: Family Medicine | Admitting: Family Medicine

## 2014-05-05 ENCOUNTER — Encounter (HOSPITAL_COMMUNITY): Payer: Self-pay | Admitting: *Deleted

## 2014-05-05 ENCOUNTER — Emergency Department (INDEPENDENT_AMBULATORY_CARE_PROVIDER_SITE_OTHER)
Admission: EM | Admit: 2014-05-05 | Discharge: 2014-05-05 | Disposition: A | Discharge status: Home / Self Care | Payer: BC Managed Care – PPO | Source: Home / Self Care | Attending: Family Medicine | Admitting: Family Medicine

## 2014-05-05 DIAGNOSIS — N760 Acute vaginitis: Secondary | ICD-10-CM | POA: Insufficient documentation

## 2014-05-05 DIAGNOSIS — Z113 Encounter for screening for infections with a predominantly sexual mode of transmission: Secondary | ICD-10-CM | POA: Insufficient documentation

## 2014-05-05 LAB — POCT URINALYSIS DIP (DEVICE)
BILIRUBIN URINE: NEGATIVE
GLUCOSE, UA: NEGATIVE mg/dL
Ketones, ur: NEGATIVE mg/dL
NITRITE: NEGATIVE
Protein, ur: NEGATIVE mg/dL
Specific Gravity, Urine: >=1.03 (ref 1.005–1.030)
UROBILINOGEN UA: 0.2 mg/dL (ref 0.0–1.0)
pH: 6 (ref 5.0–8.0)

## 2014-05-05 LAB — POCT PREGNANCY, URINE: Preg Test, Ur: NEGATIVE

## 2014-05-05 MED ORDER — LIDOCAINE HCL (PF) 1 % IJ SOLN
INTRAMUSCULAR | Status: AC
  Filled 2014-05-05: qty 5

## 2014-05-05 MED ORDER — AZITHROMYCIN 250 MG PO TABS
1000.0000 mg | ORAL_TABLET | Freq: Once | ORAL | Status: AC
  Administered 2014-05-05: 1000 mg via ORAL

## 2014-05-05 MED ORDER — CEFTRIAXONE SODIUM 250 MG IJ SOLR
250.0000 mg | Freq: Once | INTRAMUSCULAR | Status: AC
  Administered 2014-05-05: 250 mg via INTRAMUSCULAR

## 2014-05-05 MED ORDER — AZITHROMYCIN 250 MG PO TABS
ORAL_TABLET | ORAL | Status: AC
  Filled 2014-05-05: qty 4

## 2014-05-05 MED ORDER — AZITHROMYCIN 250 MG PO TABS
1000.0000 mg | ORAL_TABLET | Freq: Every day | ORAL | Status: DC
  Administered 2014-05-05: 1000 mg via ORAL

## 2014-05-05 MED ORDER — CEFTRIAXONE SODIUM 250 MG IJ SOLR
INTRAMUSCULAR | Status: AC
  Filled 2014-05-05: qty 250

## 2014-05-05 NOTE — ED Notes (Signed)
C/o vaginal discharge and irritation onset 1 week ago.  Had a 14 day period last month.  C/o cramping in lower abdomen for since before her last period.

## 2014-05-05 NOTE — Discharge Instructions (Signed)
Vaginitis °Vaginitis is an inflammation of the vagina. It is most often caused by a change in the normal balance of the bacteria and yeast that live in the vagina. This change in balance causes an overgrowth of certain bacteria or yeast, which causes the inflammation. There are different types of vaginitis, but the most common types are: °· Bacterial vaginosis. °· Yeast infection (candidiasis). °· Trichomoniasis vaginitis. This is a sexually transmitted infection (STI). °· Viral vaginitis. °· Atropic vaginitis. °· Allergic vaginitis. °CAUSES  °The cause depends on the type of vaginitis. Vaginitis can be caused by: °· Bacteria (bacterial vaginosis). °· Yeast (yeast infection). °· A parasite (trichomoniasis vaginitis) °· A virus (viral vaginitis). °· Low hormone levels (atrophic vaginitis). Low hormone levels can occur during pregnancy, breastfeeding, or after menopause. °· Irritants, such as bubble baths, scented tampons, and feminine sprays (allergic vaginitis). °Other factors can change the normal balance of the yeast and bacteria that live in the vagina. These include: °· Antibiotic medicines. °· Poor hygiene. °· Diaphragms, vaginal sponges, spermicides, birth control pills, and intrauterine devices (IUD). °· Sexual intercourse. °· Infection. °· Uncontrolled diabetes. °· A weakened immune system. °SYMPTOMS  °Symptoms can vary depending on the cause of the vaginitis. Common symptoms include: °· Abnormal vaginal discharge. °· The discharge is white, gray, or yellow with bacterial vaginosis. °· The discharge is thick, white, and cheesy with a yeast infection. °· The discharge is frothy and yellow or greenish with trichomoniasis. °· A bad vaginal odor. °· The odor is fishy with bacterial vaginosis. °· Vaginal itching, pain, or swelling. °· Painful intercourse. °· Pain or burning when urinating. °Sometimes, there are no symptoms. °TREATMENT  °Treatment will vary depending on the type of infection.  °· Bacterial  vaginosis and trichomoniasis are often treated with antibiotic creams or pills. °· Yeast infections are often treated with antifungal medicines, such as vaginal creams or suppositories. °· Viral vaginitis has no cure, but symptoms can be treated with medicines that relieve discomfort. Your sexual partner should be treated as well. °· Atrophic vaginitis may be treated with an estrogen cream, pill, suppository, or vaginal ring. If vaginal dryness occurs, lubricants and moisturizing creams may help. You may be told to avoid scented soaps, sprays, or douches. °· Allergic vaginitis treatment involves quitting the use of the product that is causing the problem. Vaginal creams can be used to treat the symptoms. °HOME CARE INSTRUCTIONS  °· Take all medicines as directed by your caregiver. °· Keep your genital area clean and dry. Avoid soap and only rinse the area with water. °· Avoid douching. It can remove the healthy bacteria in the vagina. °· Do not use tampons or have sexual intercourse until your vaginitis has been treated. Use sanitary pads while you have vaginitis. °· Wipe from front to back. This avoids the spread of bacteria from the rectum to the vagina. °· Let air reach your genital area. °¨ Wear cotton underwear to decrease moisture buildup. °¨ Avoid wearing underwear while you sleep until your vaginitis is gone. °¨ Avoid tight pants and underwear or nylons without a cotton panel. °¨ Take off wet clothing (especially bathing suits) as soon as possible. °· Use mild, non-scented products. Avoid using irritants, such as: °¨ Scented feminine sprays. °¨ Fabric softeners. °¨ Scented detergents. °¨ Scented tampons. °¨ Scented soaps or bubble baths. °· Practice safe sex and use condoms. Condoms may prevent the spread of trichomoniasis and viral vaginitis. °SEEK MEDICAL CARE IF:  °· You have abdominal pain. °· You   have a fever or persistent symptoms for more than 2-3 days. °· You have a fever and your symptoms suddenly  get worse. °Document Released: 03/11/2007 Document Revised: 02/06/2012 Document Reviewed: 10/25/2011 °ExitCare® Patient Information ©2015 [employer]. This information is not intended to replace advice given to you by your health care provider. Make sure you discuss any questions you have with your health care provider. ° °Sexually Transmitted Disease °A sexually transmitted disease (STD) is a disease or infection that may be passed (transmitted) from person to person, usually during sexual activity. This may happen by way of saliva, semen, blood, vaginal mucus, or urine. Common STDs include:  °· Gonorrhea.   °· Chlamydia.   °· Syphilis.   °· HIV and AIDS.   °· Genital herpes.   °· Hepatitis B and C.   °· Trichomonas.   °· Human papillomavirus (HPV).   °· Pubic lice.   °· Scabies. °· Mites. °· Bacterial vaginosis. °WHAT ARE CAUSES OF STDs? °An STD may be caused by bacteria, a virus, or parasites. STDs are often transmitted during sexual activity if one person is infected. However, they may also be transmitted through nonsexual means. STDs may be transmitted after:  °· Sexual intercourse with an infected person.   °· Sharing sex toys with an infected person.   °· Sharing needles with an infected person or using unclean piercing or tattoo needles. °· Having intimate contact with the genitals, mouth, or rectal areas of an infected person.   °· Exposure to infected fluids during birth. °WHAT ARE THE SIGNS AND SYMPTOMS OF STDs? °Different STDs have different symptoms. Some people may not have any symptoms. If symptoms are present, they may include:  °· Painful or bloody urination.   °· Pain in the pelvis, abdomen, vagina, anus, throat, or eyes.   °· A skin rash, itching, or irritation. °· Growths, ulcerations, blisters, or sores in the genital and anal areas. °· Abnormal vaginal discharge with or without bad odor.   °· Penile discharge in men.   °· Fever.   °· Pain or bleeding during sexual intercourse.   °· Swollen  glands in the groin area.   °· Yellow skin and eyes (jaundice). This is seen with hepatitis.   °· Swollen testicles. °· Infertility. °· Sores and blisters in the mouth. °HOW ARE STDs DIAGNOSED? °To make a diagnosis, your health care provider may:  °· Take a medical history.   °· Perform a physical exam.   °· Take a sample of any discharge to examine. °· Swab the throat, cervix, opening to the penis, rectum, or vagina for testing. °· Test a sample of your first morning urine.   °· Perform blood tests.   °· Perform a Pap test, if this applies.   °· Perform a colposcopy.   °· Perform a laparoscopy.   °HOW ARE STDs TREATED? ° Treatment depends on the STD. Some STDs may be treated but not cured.  °· Chlamydia, gonorrhea, trichomonas, and syphilis can be cured with antibiotic medicine.   °· Genital herpes, hepatitis, and HIV can be treated, but not cured, with prescribed medicines. The medicines lessen symptoms.   °· Genital warts from HPV can be treated with medicine or by freezing, burning (electrocautery), or surgery. Warts may come back.   °· HPV cannot be cured with medicine or surgery. However, abnormal areas may be removed from the cervix, vagina, or vulva.   °· If your diagnosis is confirmed, your recent sexual partners need treatment. This is true even if they are symptom-free or have a negative culture or evaluation. They should not have sex until their health care providers say it is okay. °HOW CAN I REDUCE MY   RISK OF GETTING AN STD? °Take these steps to reduce your risk of getting an STD: °· Use latex condoms, dental dams, and water-soluble lubricants during sexual activity. Do not use petroleum jelly or oils. °· Avoid having multiple sex partners. °· Do not have sex with someone who has other sex partners. °· Do not have sex with anyone you do not know or who is at high risk for an STD. °· Avoid risky sex practices that can break your skin. °· Do not have sex if you have open sores on your mouth or  skin. °· Avoid drinking too much alcohol or taking illegal drugs. Alcohol and drugs can affect your judgment and put you in a vulnerable position. °· Avoid engaging in oral and anal sex acts. °· Get vaccinated for HPV and hepatitis. If you have not received these vaccines in the past, talk to your health care provider about whether one or both might be right for you.   °· If you are at risk of being infected with HIV, it is recommended that you take a prescription medicine daily to prevent HIV infection. This is called pre-exposure prophylaxis (PrEP). You are considered at risk if: °¨ You are a man who has sex with other men (MSM). °¨ You are a heterosexual man or woman and are sexually active with more than one partner. °¨ You take drugs by injection. °¨ You are sexually active with a partner who has HIV. °· Talk with your health care provider about whether you are at high risk of being infected with HIV. If you choose to begin PrEP, you should first be tested for HIV. You should then be tested every 3 months for as long as you are taking PrEP.   °WHAT SHOULD I DO IF I THINK I HAVE AN STD? °· See your health care provider.   °· Tell your sexual partner(s). They should be tested and treated for any STDs. °· Do not have sex until your health care provider says it is okay.  °WHEN SHOULD I GET IMMEDIATE MEDICAL CARE? °Contact your health care provider right away if:  °· You have severe abdominal pain. °· You are a man and notice swelling or pain in your testicles. °· You are a woman and notice swelling or pain in your vagina. °Document Released: 08/04/2002 Document Revised: 05/19/2013 Document Reviewed: 12/02/2012 °ExitCare® Patient Information ©2015 [employer]. This information is not intended to replace advice given to you by your health care provider. Make sure you discuss any questions you have with your health care provider. ° °

## 2014-05-05 NOTE — ED Provider Notes (Signed)
CSN: [id number]     Arrival date & time 05/05/14  1928 History   First MD Initiated Contact with Patient 05/05/14 2018     Chief Complaint  Patient presents with  . Vaginal Discharge   (Consider location/radiation/quality/duration/timing/severity/associated sxs/prior Treatment) HPI          23 year old female presents for evaluation of vaginal irritation and discharge. Her symptoms have been present for about one week. She contacted her personal physician who sent in prescriptions to treat bacterial vaginosis and yeast infection, she took Diflucan and metronidazole, it has not helped. She notes that just before this started she had unprotected sexual intercourse with someone and is worried that she may have an STD. She has a history of both Chlamydia and gonorrhea which felt similar to this. She has lower abdominal pain as well. No nausea, vomiting, fever, or pelvic pain.    Past Medical History  Diagnosis Date  . Gonorrhea   . BV (bacterial vaginosis)   . Abnormal Pap smear 06/2011  . H/O candidiasis   . History of bacterial infection   . Yeast infection   . Pregnancy induced hypertension   . Chlamydia   . Bartholin cyst     w ward catheter   Past Surgical History  Procedure Laterality Date  . Wisdom tooth extraction    . Cesarean section  01/17/2012    Procedure: CESAREAN SECTION;  Surgeon: Esmeralda ArthurSandra A Rivard, MD;  Location: [facility];  Service: Obstetrics;  Laterality: N/A;  Priimary Cesarean Section Delivery Boy @0044 , Apgars 9/9   Family History  Problem Relation Age of Onset  . Cancer Maternal Aunt     Breast   . Cancer Maternal Aunt     Breast  . Hypertension Maternal Grandmother   . Other Neg Hx    History  Substance Use Topics  . Smoking status: Current Every Day Smoker -- 0.10 packs/day    Types: Cigarettes    Last Attempt to Quit: 05/25/2011  . Smokeless tobacco: Never Used     Comment: "I've quit because I can't tolerate the smell anymore."  . Alcohol Use: No   OB  History    Gravida Para Term Preterm AB TAB SAB Ectopic Multiple Living   1 1 1  0 0 0 0 0 0 1     Review of Systems  Constitutional: Negative for fever.  Gastrointestinal: Positive for abdominal pain. Negative for nausea, vomiting and diarrhea.  Genitourinary: Positive for vaginal discharge and vaginal pain. Negative for vaginal bleeding.  All other systems reviewed and are negative.   Allergies  Review of patient's allergies indicates no known allergies.  Home Medications   Prior to Admission medications   Medication Sig Start Date End Date Taking? Authorizing Provider  HYDROcodone-acetaminophen (NORCO/VICODIN) 5-325 MG per tablet Take 1-2 tablets by mouth every 4 (four) hours as needed. 03/02/14   Tiffany Irine SealG Greene, PA-C  metroNIDAZOLE (FLAGYL) 500 MG tablet Take 1 tablet (500 mg total) by mouth 2 (two) times daily. 03/02/14   Tiffany Irine SealG Greene, PA-C  ondansetron (ZOFRAN) 4 MG tablet Take 1 tablet (4 mg total) by mouth every 6 (six) hours. 03/02/14   Tiffany Irine SealG Greene, PA-C   BP 121/89 mmHg  Pulse 103  Temp(Src) 97.9 F (36.6 C) (Oral)  Resp 16  SpO2 99%  LMP 04/05/2014 Physical Exam  Constitutional: She is oriented to person, place, and time. Vital signs are normal. She appears well-developed and well-nourished. No distress.  HENT:  Head: Normocephalic and atraumatic.  Right Ear: External ear normal.  Left Ear: External ear normal.  Nose: Nose normal.  Mouth/Throat: Oropharynx is clear and moist. No oropharyngeal exudate.  Cardiovascular: Normal rate, regular rhythm and normal heart sounds.   Pulmonary/Chest: Effort normal and breath sounds normal. No respiratory distress.  Genitourinary: Rectum normal and uterus normal. There is no rash, tenderness or lesion on the right labia. There is no rash, tenderness or lesion on the left labia. Cervix exhibits no motion tenderness, no discharge and no friability. Right adnexum displays no tenderness. Left adnexum displays no tenderness.  No erythema or bleeding in the vagina. Vaginal discharge found.  Lymphadenopathy:       Right: No inguinal adenopathy present.       Left: No inguinal adenopathy present.  Neurological: She is alert and oriented to person, place, and time. She has normal strength. Coordination normal.  Skin: Skin is warm and dry. No rash noted. She is not diaphoretic.  Psychiatric: She has a normal mood and affect. Judgment normal.  Nursing note and vitals reviewed.   ED Course  Procedures (including critical care time) Labs Review Labs Reviewed  POCT URINALYSIS DIP (DEVICE) - Abnormal; Notable for the following:    Hgb urine dipstick TRACE (*)    Leukocytes, UA SMALL (*)    All other components within normal limits  URINE CULTURE  RPR  HIV ANTIBODY (ROUTINE TESTING)  POCT PREGNANCY, URINE  CERVICOVAGINAL ANCILLARY ONLY    Imaging Review No results found.   MDM   1. Vaginitis    Given that she has failed treatment for BV and for yeast infection, I believe she most likely has an STD. Treat with Rocephin and azithromycin. Cytology sent. Follow-up when necessary   Meds ordered this encounter  Medications  . cefTRIAXone (ROCEPHIN) injection 250 mg    Sig:   . azithromycin (ZITHROMAX) tablet 1,000 mg    Sig:      Graylon GoodZachary H Talayla Doyel, PA-C 05/05/14 2119   Urine culture positive for greater than 100,000 colonies, no predominant organism, called pt and she is feeling much better but she thinks she has a yeast infection now.  Has vaginal irritation and itching.  Will send in diflucan, come back if not improving.    Graylon GoodZachary H Evolet Salminen, PA-C 05/08/14 [phone]

## 2014-05-06 LAB — CERVICOVAGINAL ANCILLARY ONLY
Chlamydia: NEGATIVE
NEISSERIA GONORRHEA: NEGATIVE
WET PREP (BD AFFIRM): NEGATIVE
WET PREP (BD AFFIRM): NEGATIVE
Wet Prep (BD Affirm): NEGATIVE

## 2014-05-06 LAB — RPR

## 2014-05-06 LAB — HIV ANTIBODY (ROUTINE TESTING W REFLEX): HIV: NONREACTIVE

## 2014-05-07 LAB — URINE CULTURE

## 2014-05-08 MED ORDER — FLUCONAZOLE 150 MG PO TABS: 150.0000 mg | ORAL_TABLET | Freq: Once | ORAL | Status: DC

## 2014-06-02 ENCOUNTER — Encounter (HOSPITAL_COMMUNITY): Payer: Self-pay | Admitting: *Deleted

## 2014-06-02 ENCOUNTER — Emergency Department (INDEPENDENT_AMBULATORY_CARE_PROVIDER_SITE_OTHER)
Admission: EM | Admit: 2014-06-02 | Discharge: 2014-06-02 | Disposition: A | Discharge status: Home / Self Care | Payer: BLUE CROSS/BLUE SHIELD | Source: Home / Self Care | Attending: Family Medicine | Admitting: Family Medicine

## 2014-06-02 DIAGNOSIS — N760 Acute vaginitis: Secondary | ICD-10-CM

## 2014-06-02 LAB — POCT URINALYSIS DIP (DEVICE)
Bilirubin Urine: NEGATIVE
GLUCOSE, UA: NEGATIVE mg/dL
KETONES UR: NEGATIVE mg/dL
Leukocytes, UA: NEGATIVE
NITRITE: NEGATIVE
Protein, ur: NEGATIVE mg/dL
Urobilinogen, UA: 0.2 mg/dL (ref 0.0–1.0)
pH: 5.5 (ref 5.0–8.0)

## 2014-06-02 LAB — POCT PREGNANCY, URINE: Preg Test, Ur: NEGATIVE

## 2014-06-02 NOTE — ED Provider Notes (Signed)
CSN: [id number]     Arrival date & time 06/02/14  0841 History   First MD Initiated Contact with Patient 06/02/14 [phone]     Chief Complaint  Patient presents with  . Vaginitis   (Consider location/radiation/quality/duration/timing/severity/associated sxs/prior Treatment) HPI Comments: Patient presents for re-evaluation of persistent vaginal itching and discharge. Thus far, since 05/05/2014, has been treated for BV, yeast vaginitis, gonorrhea, chlamydia. States symptoms persist. Has not yet seen GYN  Provider.  Reports herself to be otherwise healthy Smoker Unemployed LNMP: 05/07/2014 Denies recent sexual activity   The history is provided by the patient.    Past Medical History  Diagnosis Date  . Gonorrhea   . BV (bacterial vaginosis)   . Abnormal Pap smear 06/2011  . H/O candidiasis   . History of bacterial infection   . Yeast infection   . Pregnancy induced hypertension   . Chlamydia   . Bartholin cyst     w ward catheter   Past Surgical History  Procedure Laterality Date  . Wisdom tooth extraction    . Cesarean section  01/17/2012    Procedure: CESAREAN SECTION;  Surgeon: Esmeralda Arthur, MD;  Location: [facility];  Service: Obstetrics;  Laterality: N/A;  Priimary Cesarean Section Delivery Boy , Apgars 9/9   Family History  Problem Relation Age of Onset  . Cancer Maternal Aunt     Breast   . Cancer Maternal Aunt     Breast  . Hypertension Maternal Grandmother   . Other Neg Hx    History  Substance Use Topics  . Smoking status: Current Every Day Smoker -- 0.10 packs/day    Types: Cigarettes    Last Attempt to Quit: 05/25/2011  . Smokeless tobacco: Never Used     Comment: "I've quit because I can't tolerate the smell anymore."  . Alcohol Use: No   OB History    Gravida Para Term Preterm AB TAB SAB Ectopic Multiple Living   0 0 0 0 0 0 1     Review of Systems  All other systems reviewed and are negative.   Allergies  Review of patient's allergies  indicates no known allergies.  Home Medications   Prior to Admission medications   Medication Sig Start Date End Date Taking? Authorizing Provider  fluconazole (DIFLUCAN) 150 MG tablet Take 1 tablet (150 mg total) by mouth once. Pick up the refill and and take second dose in 5 days if symptoms have not resolved 05/08/14   Graylon Good, PA-C  HYDROcodone-acetaminophen (NORCO/VICODIN) 5-325 MG per tablet Take 1-2 tablets by mouth every 4 (four) hours as needed. 03/02/14   Tiffany Irine Seal, PA-C  metroNIDAZOLE (FLAGYL) 500 MG tablet Take 1 tablet (500 mg total) by mouth 2 (two) times daily. 03/02/14   Tiffany Irine Seal, PA-C  ondansetron (ZOFRAN) 4 MG tablet Take 1 tablet (4 mg total) by mouth every 6 (six) hours. 03/02/14   Tiffany Irine Seal, PA-C   BP 121/87 mmHg  Pulse 77  Temp(Src) 98.3 F (36.8 C) (Oral)  Resp 18  SpO2 100%  LMP 05/03/2014 Physical Exam  Constitutional: She is oriented to person, place, and time. She appears well-developed and well-nourished. No distress.  Cardiovascular: Normal rate.   Pulmonary/Chest: Effort normal.  Genitourinary: Uterus normal. Pelvic exam was performed with patient supine. There is no rash, tenderness or lesion on the right labia. There is no rash, tenderness or lesion on the left labia. Cervix exhibits no motion tenderness and no  friability. Right adnexum displays no mass and no tenderness. Left adnexum displays no mass, no tenderness and no fullness. There is erythema and bleeding in the vagina. No tenderness in the vagina. There is a foreign body in the vagina. No signs of injury around the vagina. Vaginal discharge found.  Thick white vaginal discharge  Neurological: She is alert and oriented to person, place, and time.  Nursing note and vitals reviewed.   ED Course  Procedures (including critical care time) Labs Review Labs Reviewed  POCT URINALYSIS DIP (DEVICE) - Abnormal; Notable for the following:    Hgb urine dipstick TRACE (*)    All  other components within normal limits  POCT PREGNANCY, URINE  CERVICOVAGINAL ANCILLARY ONLY    Imaging Review No results found.   MDM   1. Vaginitis    Patient does not wish to be empirically treated with medication. Would like to wait until lab results available and base treatment upon results. Will call when labs available. Advised to use OTC monistat until labs available. Advised to follow up with her ObGyn if labs negative and symptoms persist and she already had multiple treatment failures.     Ria ClockJennifer Lee H Delainey Winstanley, PA 06/02/14 1000

## 2014-06-02 NOTE — Discharge Instructions (Signed)
You will be contacted by phone with your lab results should they indicate the need for treatment. May try using over the counter Monistat until lab result available Vaginitis Vaginitis is an inflammation of the vagina. It is most often caused by a change in the normal balance of the bacteria and yeast that live in the vagina. This change in balance causes an overgrowth of certain bacteria or yeast, which causes the inflammation. There are different types of vaginitis, but the most common types are:  Bacterial vaginosis.  Yeast infection (candidiasis).  Trichomoniasis vaginitis. This is a sexually transmitted infection (STI).  Viral vaginitis.  Atropic vaginitis.  Allergic vaginitis. CAUSES  The cause depends on the type of vaginitis. Vaginitis can be caused by:  Bacteria (bacterial vaginosis).  Yeast (yeast infection).  A parasite (trichomoniasis vaginitis)  A virus (viral vaginitis).  Low hormone levels (atrophic vaginitis). Low hormone levels can occur during pregnancy, breastfeeding, or after menopause.  Irritants, such as bubble baths, scented tampons, and feminine sprays (allergic vaginitis). Other factors can change the normal balance of the yeast and bacteria that live in the vagina. These include:  Antibiotic medicines.  Poor hygiene.  Diaphragms, vaginal sponges, spermicides, birth control pills, and intrauterine devices (IUD).  Sexual intercourse.  Infection.  Uncontrolled diabetes.  A weakened immune system. SYMPTOMS  Symptoms can vary depending on the cause of the vaginitis. Common symptoms include:  Abnormal vaginal discharge.  The discharge is white, gray, or yellow with bacterial vaginosis.  The discharge is thick, white, and cheesy with a yeast infection.  The discharge is frothy and yellow or greenish with trichomoniasis.  A bad vaginal odor.  The odor is fishy with bacterial vaginosis.  Vaginal itching, pain, or swelling.  Painful  intercourse.  Pain or burning when urinating. Sometimes, there are no symptoms. TREATMENT  Treatment will vary depending on the type of infection.   Bacterial vaginosis and trichomoniasis are often treated with antibiotic creams or pills.  Yeast infections are often treated with antifungal medicines, such as vaginal creams or suppositories.  Viral vaginitis has no cure, but symptoms can be treated with medicines that relieve discomfort. Your sexual partner should be treated as well.  Atrophic vaginitis may be treated with an estrogen cream, pill, suppository, or vaginal ring. If vaginal dryness occurs, lubricants and moisturizing creams may help. You may be told to avoid scented soaps, sprays, or douches.  Allergic vaginitis treatment involves quitting the use of the product that is causing the problem. Vaginal creams can be used to treat the symptoms. HOME CARE INSTRUCTIONS   Take all medicines as directed by your caregiver.  Keep your genital area clean and dry. Avoid soap and only rinse the area with water.  Avoid douching. It can remove the healthy bacteria in the vagina.  Do not use tampons or have sexual intercourse until your vaginitis has been treated. Use sanitary pads while you have vaginitis.  Wipe from front to back. This avoids the spread of bacteria from the rectum to the vagina.  Let air reach your genital area.  Wear cotton underwear to decrease moisture buildup.  Avoid wearing underwear while you sleep until your vaginitis is gone.  Avoid tight pants and underwear or nylons without a cotton panel.  Take off wet clothing (especially bathing suits) as soon as possible.  Use mild, non-scented products. Avoid using irritants, such as:  Scented feminine sprays.  Fabric softeners.  Scented detergents.  Scented tampons.  Scented soaps or bubble baths.  Practice safe sex and use condoms. Condoms may prevent the spread of trichomoniasis and viral  vaginitis. SEEK MEDICAL CARE IF:   You have abdominal pain.  You have a fever or persistent symptoms for more than 2-3 days.  You have a fever and your symptoms suddenly get worse. Document Released: 03/11/2007 Document Revised: 02/06/2012 Document Reviewed: 10/25/2011 [facility] Patient Information 2015 [place], [place]. This information is not intended to replace advice given to you by your health care provider. Make sure you discuss any questions you have with your health care provider.

## 2014-06-02 NOTE — ED Notes (Signed)
Pt  Reports      Symptoms  Of     Vaginal  Irritation         And      Cottage  Cheese  Type discharge  For     About  1  Month    She  Reports         Was  Treated  With   Diflucan  And  metrogal  Recently

## 2014-06-03 LAB — CERVICOVAGINAL ANCILLARY ONLY
CHLAMYDIA, DNA PROBE: NEGATIVE
Neisseria Gonorrhea: NEGATIVE
Wet Prep (BD Affirm): NEGATIVE
Wet Prep (BD Affirm): NEGATIVE
Wet Prep (BD Affirm): POSITIVE — AB

## 2014-06-04 NOTE — ED Notes (Addendum)
GC/Chlamydia neg., Affirm: Candida and Trich neg., Gardnerella pos.  [facility]. Vassie MoselleYork, Cigi Bega M 06/04/2014 Lora HavensLee Pressson PA sent Rx. for Cleocin vaginal gel to pt.'s pharmacy and notified pt. on 06/05/14. 06/07/2014

## 2014-06-05 MED ORDER — CLINDAMYCIN PHOSPHATE 2 % VA CREA: 1.0000 | TOPICAL_CREAM | Freq: Every day | VAGINAL | Status: DC

## 2014-06-05 NOTE — Progress Notes (Signed)
Patient's labs results consistent with BV. Has already completed diflucan and metronidazole. Contacted patient and sent electronic prescription for Cleocin vaginal cream electronically to patient's pharmacy of record. Contacted patient by phone and made her aware of results and new prescription. Questions answered.

## 2014-09-06 ENCOUNTER — Emergency Department (HOSPITAL_COMMUNITY)
Admission: EM | Admit: 2014-09-06 | Discharge: 2014-09-06 | Disposition: A | Discharge status: Home / Self Care | Payer: BLUE CROSS/BLUE SHIELD | Attending: Emergency Medicine | Admitting: Emergency Medicine

## 2014-09-06 ENCOUNTER — Encounter (HOSPITAL_COMMUNITY): Payer: Self-pay | Admitting: Emergency Medicine

## 2014-09-06 DIAGNOSIS — N898 Other specified noninflammatory disorders of vagina: Secondary | ICD-10-CM | POA: Diagnosis not present

## 2014-09-06 DIAGNOSIS — Z792 Long term (current) use of antibiotics: Secondary | ICD-10-CM | POA: Diagnosis not present

## 2014-09-06 DIAGNOSIS — J0110 Acute frontal sinusitis, unspecified: Secondary | ICD-10-CM | POA: Insufficient documentation

## 2014-09-06 DIAGNOSIS — Z8619 Personal history of other infectious and parasitic diseases: Secondary | ICD-10-CM | POA: Insufficient documentation

## 2014-09-06 DIAGNOSIS — Z3202 Encounter for pregnancy test, result negative: Secondary | ICD-10-CM | POA: Diagnosis not present

## 2014-09-06 DIAGNOSIS — Z720 Tobacco use: Secondary | ICD-10-CM | POA: Insufficient documentation

## 2014-09-06 LAB — URINALYSIS, ROUTINE W REFLEX MICROSCOPIC
Bilirubin Urine: NEGATIVE
Glucose, UA: NEGATIVE mg/dL
HGB URINE DIPSTICK: NEGATIVE
Ketones, ur: NEGATIVE mg/dL
Leukocytes, UA: NEGATIVE
Nitrite: NEGATIVE
PH: 5.5 (ref 5.0–8.0)
Protein, ur: NEGATIVE mg/dL
Specific Gravity, Urine: 1.029 (ref 1.005–1.030)
UROBILINOGEN UA: 0.2 mg/dL (ref 0.0–1.0)

## 2014-09-06 LAB — WET PREP, GENITAL
Clue Cells Wet Prep HPF POC: NONE SEEN
Trich, Wet Prep: NONE SEEN
Yeast Wet Prep HPF POC: NONE SEEN

## 2014-09-06 LAB — PREGNANCY, URINE: Preg Test, Ur: NEGATIVE

## 2014-09-06 LAB — GC/CHLAMYDIA PROBE AMP (~~LOC~~) NOT AT ARMC
CHLAMYDIA, DNA PROBE: NEGATIVE
Neisseria Gonorrhea: NEGATIVE

## 2014-09-06 MED ORDER — AMOXICILLIN-POT CLAVULANATE 875-125 MG PO TABS: 1.0000 | ORAL_TABLET | Freq: Two times a day (BID) | ORAL | Status: DC

## 2014-09-06 MED ORDER — FLUCONAZOLE 150 MG PO TABS: 150.0000 mg | ORAL_TABLET | Freq: Once | ORAL | Status: DC

## 2014-09-06 NOTE — ED Provider Notes (Signed)
CSN: [id number]     Arrival date & time 09/06/14  0827 History   First MD Initiated Contact with Patient 09/06/14 0831     Chief Complaint  Patient presents with  . Vaginitis     (Consider location/radiation/quality/duration/timing/severity/associated sxs/prior Treatment) HPI Comments: Pt c/o of vaginal itching for two days. Pt sts she has hx of yeast infections once a month before or after period. Pt sts she has had cough, sore throat, L ear ache and sinus drainage for 3 weeks. Pt sts has taken OTC medicine to try help treat cold symptoms but denies relief.       Patient is a 24 y.o. female presenting with vaginal discharge and URI. The history is provided by the patient.  Vaginal Discharge Quality:  White Severity:  Moderate Onset quality:  Sudden Duration:  2 days Timing:  Constant Progression:  Unchanged Chronicity:  Recurrent Context: not at rest, not during bowel movement, not during intercourse, not during pregnancy, not during urination and not recent antibiotic use   Relieved by:  None tried Worsened by:  Nothing tried Ineffective treatments:  None tried Associated symptoms: vaginal itching   Associated symptoms: no abdominal pain, no dyspareunia, no dysuria, no fever, no genital lesions, no nausea, no rash, no urinary frequency, no urinary hesitancy, no urinary incontinence and no vomiting   Risk factors: no foreign body, no gynecological surgery, no immunosuppression, no PID, no prior miscarriage and no STI exposure   URI Presenting symptoms: congestion, cough and rhinorrhea   Presenting symptoms: no fever   Congestion:    Location:  Nasal   Interferes with sleep: yes   Cough:    Cough characteristics:  Non-productive   Sputum characteristics:  Nondescript   Severity:  Mild   Duration:  2 weeks Severity:  Moderate Onset quality:  Gradual Duration:  2 weeks Timing:  Constant Progression:  Worsening Chronicity:  New Relieved by:  Nothing Worsened by:   Certain positions Ineffective treatments:  Decongestant, hot fluids and OTC medications Associated symptoms: sinus pain   Risk factors: no recent illness, no recent travel and no sick contacts     Past Medical History  Diagnosis Date  . Gonorrhea   . BV (bacterial vaginosis)   . Abnormal Pap smear 06/2011  . H/O candidiasis   . History of bacterial infection   . Yeast infection   . Pregnancy induced hypertension   . Chlamydia   . Bartholin cyst     w ward catheter   Past Surgical History  Procedure Laterality Date  . Wisdom tooth extraction    . Cesarean section  01/17/2012    Procedure: CESAREAN SECTION;  Surgeon: Esmeralda Arthur, MD;  Location: [facility];  Service: Obstetrics;  Laterality: N/A;  Priimary Cesarean Section Delivery Boy , Apgars 9/9   Family History  Problem Relation Age of Onset  . Cancer Maternal Aunt     Breast   . Cancer Maternal Aunt     Breast  . Hypertension Maternal Grandmother   . Other Neg Hx    History  Substance Use Topics  . Smoking status: Current Every Day Smoker -- 0.10 packs/day    Types: Cigarettes    Last Attempt to Quit: 05/25/2011  . Smokeless tobacco: Never Used     Comment: "I've quit because I can't tolerate the smell anymore."  . Alcohol Use: No   OB History    Gravida Para Term Preterm AB TAB SAB Ectopic Multiple Living   1 1  1 0 0 0 0 0 0 1     Review of Systems  Constitutional: Negative for fever.  HENT: Positive for congestion and rhinorrhea.   Respiratory: Positive for cough.   Gastrointestinal: Negative for nausea, vomiting and abdominal pain.  Genitourinary: Positive for vaginal discharge. Negative for bladder incontinence, dysuria, hesitancy and dyspareunia.      Allergies  Review of patient's allergies indicates no known allergies.  Home Medications   Prior to Admission medications   Medication Sig Start Date End Date Taking? Authorizing Provider  clindamycin (CLEOCIN) 2 % vaginal cream Place 1  Applicatorful vaginally at bedtime. X 7 nights Patient not taking: Reported on 09/06/2014 06/05/14   Mathis FareJennifer Lee H Presson, PA  fluconazole (DIFLUCAN) 150 MG tablet Take 1 tablet (150 mg total) by mouth once. Pick up the refill and and take second dose in 5 days if symptoms have not resolved Patient not taking: Reported on 09/06/2014 05/08/14   Graylon GoodZachary H Baker, PA-C  HYDROcodone-acetaminophen (NORCO/VICODIN) 5-325 MG per tablet Take 1-2 tablets by mouth every 4 (four) hours as needed. Patient not taking: Reported on 09/06/2014 03/02/14   Marlon Peliffany Greene, PA-C  metroNIDAZOLE (FLAGYL) 500 MG tablet Take 1 tablet (500 mg total) by mouth 2 (two) times daily. Patient not taking: Reported on 09/06/2014 03/02/14   Marlon Peliffany Greene, PA-C  ondansetron (ZOFRAN) 4 MG tablet Take 1 tablet (4 mg total) by mouth every 6 (six) hours. Patient not taking: Reported on 09/06/2014 03/02/14   Marlon Peliffany Greene, PA-C   BP 111/69 mmHg  Pulse 99  Temp(Src) 98 F (36.7 C) (Oral)  Resp 18  Ht 5\' 7"  (1.702 m)  Wt 200 lb (90.719 kg)  BMI 31.32 kg/m2  SpO2 99%  LMP 08/23/2014 Physical Exam  Constitutional: She is oriented to person, place, and time. She appears well-developed and well-nourished. No distress.  HENT:  Head: Normocephalic and atraumatic.  Right Ear: External ear normal.  Left Ear: External ear normal.  Nose: Rhinorrhea present. Right sinus exhibits frontal sinus tenderness. Left sinus exhibits frontal sinus tenderness.  Mouth/Throat: Uvula is midline, oropharynx is clear and moist and mucous membranes are normal.  Eyes: Conjunctivae are normal.  Neck: Normal range of motion. Neck supple.  No nuchal rigidity.   Cardiovascular: Normal rate.   Pulmonary/Chest: Effort normal.  Abdominal: Soft.  Musculoskeletal: Normal range of motion.  Neurological: She is alert and oriented to person, place, and time.  Skin: Skin is warm and dry. She is not diaphoretic.  Psychiatric: She has a normal mood and affect.  Nursing  note and vitals reviewed.  Exam performed by Francee PiccoloPIEPENBRINK, Kenora Spayd L,  exam chaperoned Date: 09/06/2014 Pelvic exam: normal external genitalia without evidence of trauma. VULVA: normal appearing vulva with no masses, tenderness or lesion. VAGINA: normal appearing vagina with normal color and discharge, no lesions. CERVIX: normal appearing cervix without lesions, cervical motion tenderness absent, cervical os closed with out purulent discharge; vaginal discharge - white, Wet prep and DNA probe for chlamydia and GC obtained.   ADNEXA: normal adnexa in size, nontender and no masses UTERUS: uterus is normal size, shape, consistency and nontender.    ED Course  Procedures (including critical care time) Medications - No data to display  Labs Review Labs Reviewed - No data to display  Imaging Review No results found.   EKG Interpretation None      Patient asking for diflucan prescription as increased history of yeast infection with antibiotic use. MDM   Final diagnoses:  None  Filed Vitals:   09/06/14 0849  BP: 111/69  Pulse:   Temp:   Resp:    Afebrile, NAD, non-toxic appearing, AAOx4.   1) Sinusitis: Patient complaining of symptoms of sinusitis.  Severe symptoms have been present for greater than 10 days with purulent nasal discharge and maxillary sinus pain.  Concern for acute bacterial rhinosinusitis.  Patient discharged with Augmentin.  Instructions given for warm saline nasal wash and recommendations for follow-up with primary care physician.    2) Vaginal Discharge: Patient to be discharged with instructions to follow up with OBGYN. Pt understands GC/Chlamydia cultures pending and that they will need to inform all sexual partners within the last 6 months if results return positive and return here, Ob/Gyn, or health department for treatment.   Return precautions discussed. Patient is agreeable to plan. Patient is stable at time of discharge           Francee Piccolo, PA-C 09/06/14 1115  Raeford Razor, MD 09/06/14 [id number]

## 2014-09-06 NOTE — Discharge Instructions (Signed)
Please follow up with your primary care physician in 1-2 days. If you do not have one please call the [facility] number listed above. Please take your antibiotic until completion. Please read all discharge instructions and return precautions.  ° ° °Sinusitis °Sinusitis is redness, soreness, and inflammation of the paranasal sinuses. Paranasal sinuses are air pockets within the bones of your face (beneath the eyes, the middle of the forehead, or above the eyes). In healthy paranasal sinuses, mucus is able to drain out, and air is able to circulate through them by way of your nose. However, when your paranasal sinuses are inflamed, mucus and air can become trapped. This can allow bacteria and other germs to grow and cause infection. °Sinusitis can develop quickly and last only a short time (acute) or continue over a long period (chronic). Sinusitis that lasts for more than 12 weeks is considered chronic.  °CAUSES  °Causes of sinusitis include: °· Allergies. °· Structural abnormalities, such as displacement of the cartilage that separates your nostrils (deviated septum), which can decrease the air flow through your nose and sinuses and affect sinus drainage. °· Functional abnormalities, such as when the small hairs (cilia) that line your sinuses and help remove mucus do not work properly or are not present. °SIGNS AND SYMPTOMS  °Symptoms of acute and chronic sinusitis are the same. The primary symptoms are pain and pressure around the affected sinuses. Other symptoms include: °· Upper toothache. °· Earache. °· Headache. °· Bad breath. °· Decreased sense of smell and taste. °· A cough, which worsens when you are lying flat. °· Fatigue. °· Fever. °· Thick drainage from your nose, which often is green and may contain pus (purulent). °· Swelling and warmth over the affected sinuses. °DIAGNOSIS  °Your health care provider will perform a physical exam. During the exam, your health care provider  may: °· Look in your nose for signs of abnormal growths in your nostrils (nasal polyps). °· Tap over the affected sinus to check for signs of infection. °· View the inside of your sinuses (endoscopy) using an imaging device that has a light attached (endoscope). °If your health care provider suspects that you have chronic sinusitis, one or more of the following tests may be recommended: °· Allergy tests. °· Nasal culture. A sample of mucus is taken from your nose, sent to a lab, and screened for bacteria. °· Nasal cytology. A sample of mucus is taken from your nose and examined by your health care provider to determine if your sinusitis is related to an allergy. °TREATMENT  °Most cases of acute sinusitis are related to a viral infection and will resolve on their own within 10 days. Sometimes medicines are prescribed to help relieve symptoms (pain medicine, decongestants, nasal steroid sprays, or saline sprays).  °However, for sinusitis related to a bacterial infection, your health care provider will prescribe antibiotic medicines. These are medicines that will help kill the bacteria causing the infection.  °Rarely, sinusitis is caused by a fungal infection. In theses cases, your health care provider will prescribe antifungal medicine. °For some cases of chronic sinusitis, surgery is needed. Generally, these are cases in which sinusitis recurs more than 3 times per year, despite other treatments. °HOME CARE INSTRUCTIONS  °· Drink plenty of water. Water helps thin the mucus so your sinuses can drain more easily. °· Use a humidifier. °· Inhale steam 3 to 4 times a day (for example, sit in the bathroom with the shower running). °· Apply   a warm, moist washcloth to your face 3 to 4 times a day, or as directed by your health care provider. °· Use saline nasal sprays to help moisten and clean your sinuses. °· Take medicines only as directed by your health care provider. °· If you were prescribed either an antibiotic or  antifungal medicine, finish it all even if you start to feel better. °SEEK IMMEDIATE MEDICAL CARE IF: °· You have increasing pain or severe headaches. °· You have nausea, vomiting, or drowsiness. °· You have swelling around your face. °· You have vision problems. °· You have a stiff neck. °· You have difficulty breathing. °MAKE SURE YOU:  °· Understand these instructions. °· Will watch your condition. °· Will get help right away if you are not doing well or get worse. °Document Released: 05/14/2005 Document Revised: 09/28/2013 Document Reviewed: 05/29/2011 °[employer]® Patient Information ©2015 [employer]. This information is not intended to replace advice given to you by your health care provider. Make sure you discuss any questions you have with your health care provider. ° ° ° °

## 2014-09-06 NOTE — ED Notes (Signed)
Pt c/o of vaginal itching for two days. Pt sts she has hx of yeast infections once a month before or after period. Pt sts she has had cough, sore throat, L ear ache and sinus drainage for 3 weeks. Pt sts has taken OTC medicine to try help treat cold symptoms but denies relief.

## 2015-07-05 ENCOUNTER — Encounter (HOSPITAL_COMMUNITY): Payer: Self-pay

## 2015-07-05 ENCOUNTER — Emergency Department (HOSPITAL_COMMUNITY)
Admission: EM | Admit: 2015-07-05 | Discharge: 2015-07-05 | Disposition: A | Discharge status: Home / Self Care | Payer: BLUE CROSS/BLUE SHIELD | Attending: Emergency Medicine | Admitting: Emergency Medicine

## 2015-07-05 DIAGNOSIS — F17210 Nicotine dependence, cigarettes, uncomplicated: Secondary | ICD-10-CM | POA: Insufficient documentation

## 2015-07-05 DIAGNOSIS — Z8619 Personal history of other infectious and parasitic diseases: Secondary | ICD-10-CM | POA: Insufficient documentation

## 2015-07-05 DIAGNOSIS — Z792 Long term (current) use of antibiotics: Secondary | ICD-10-CM | POA: Insufficient documentation

## 2015-07-05 DIAGNOSIS — Z8742 Personal history of other diseases of the female genital tract: Secondary | ICD-10-CM | POA: Insufficient documentation

## 2015-07-05 DIAGNOSIS — J069 Acute upper respiratory infection, unspecified: Secondary | ICD-10-CM | POA: Insufficient documentation

## 2015-07-05 NOTE — ED Provider Notes (Signed)
CSN: [id number]     Arrival date & time 07/05/15  1728 History  By signing my name below, I, Ronney Lion, attest that this documentation has been prepared under the direction and in the presence of S. Lane Hacker, PA-C. Electronically Signed: Ronney Lion, ED Scribe. 07/05/2015. 6:16 PM.    Chief Complaint  Patient presents with  . Nasal Congestion   The history is provided by the patient. No language interpreter was used.    HPI Comments: Kelly Chan is a 25 y.o. female with no pertinent PMHx, who presents to the Emergency Department with multiple complaints, including a rhinorrhea, nasal congestion, dry cough, sneezing, and subjective fever that began 3-4 days ago. She states she only came to the ED today because her work made her come here for a doctor's note. She states she had taken Nyquil and Theraflu with mild relief. She denies sore throat, abdominal pain, nausea, or vomiting.  Past Medical History  Diagnosis Date  . Gonorrhea   . BV (bacterial vaginosis)   . Abnormal Pap smear 06/2011  . H/O candidiasis   . History of bacterial infection   . Yeast infection   . Pregnancy induced hypertension   . Chlamydia   . Bartholin cyst     w ward catheter   Past Surgical History  Procedure Laterality Date  . Wisdom tooth extraction    . Cesarean section  01/17/2012    Procedure: CESAREAN SECTION;  Surgeon: Esmeralda Arthur, MD;  Location: [facility];  Service: Obstetrics;  Laterality: N/A;  Priimary Cesarean Section Delivery Boy , Apgars 9/9   Family History  Problem Relation Age of Onset  . Cancer Maternal Aunt     Breast   . Cancer Maternal Aunt     Breast  . Hypertension Maternal Grandmother   . Other Neg Hx    Social History  Substance Use Topics  . Smoking status: Current Every Day Smoker -- 0.10 packs/day    Types: Cigarettes    Last Attempt to Quit: 05/25/2011  . Smokeless tobacco: Never Used     Comment: "I've quit because I can't tolerate the smell anymore."  .  Alcohol Use: No   OB History    Gravida Para Term Preterm AB TAB SAB Ectopic Multiple Living   0 0 0 0 0 0 1     Review of Systems  Constitutional: Positive for fever (subjective).  HENT: Positive for congestion, rhinorrhea and sneezing. Negative for sore throat.   Respiratory: Positive for cough.   Gastrointestinal: Negative for nausea, vomiting and abdominal pain.  All other systems reviewed and are negative.     Allergies  Review of patient's allergies indicates no known allergies.  Home Medications   Prior to Admission medications   Medication Sig Start Date End Date Taking? Authorizing Provider  amoxicillin-clavulanate (AUGMENTIN) 875-125 MG per tablet Take 1 tablet by mouth every 12 (twelve) hours. 09/06/14   Jennifer Piepenbrink, PA-C  clindamycin (CLEOCIN) 2 % vaginal cream Place 1 Applicatorful vaginally at bedtime. X 7 nights Patient not taking: Reported on 09/06/2014 06/05/14   Mathis Fare Presson, PA  fluconazole (DIFLUCAN) 150 MG tablet Take 1 tablet (150 mg total) by mouth once. Pick up the refill and and take second dose in 5 days if symptoms have not resolved Patient not taking: Reported on 09/06/2014 05/08/14   Graylon Good, PA-C  fluconazole (DIFLUCAN) 150 MG tablet Take 1 tablet (150 mg total) by mouth once. 09/06/14  Jennifer Piepenbrink, PA-C  HYDROcodone-acetaminophen (NORCO/VICODIN) 5-325 MG per tablet Take 1-2 tablets by mouth every 4 (four) hours as needed. Patient not taking: Reported on 09/06/2014 03/02/14   Marlon Pel, PA-C  metroNIDAZOLE (FLAGYL) 500 MG tablet Take 1 tablet (500 mg total) by mouth 2 (two) times daily. Patient not taking: Reported on 09/06/2014 03/02/14   Marlon Pel, PA-C  ondansetron (ZOFRAN) 4 MG tablet Take 1 tablet (4 mg total) by mouth every 6 (six) hours. Patient not taking: Reported on 09/06/2014 03/02/14   Marlon Pel, PA-C   BP 124/86 mmHg  Pulse 93  Temp(Src) 98.5 F (36.9 C) (Oral)  Resp 18  Ht  (1.702  m)  Wt 195 lb (88.451 kg)  BMI 30.53 kg/m2  SpO2 98%  LMP 06/09/2015 Physical Exam  Constitutional: She is oriented to person, place, and time. She appears well-developed and well-nourished. No distress.  HENT:  Head: Normocephalic and atraumatic.  Mouth/Throat: Uvula is midline. No oropharyngeal exudate or posterior oropharyngeal erythema.  No erythema or tonsillar exudate. No uvular deviation.   Eyes: Conjunctivae and EOM are normal.  Neck: Neck supple. No tracheal deviation present.  No tender cervical adenopathy.   Cardiovascular: Normal rate.   Pulmonary/Chest: Effort normal and breath sounds normal. No respiratory distress. She has no wheezes. She has no rales.  No wheezing or rales.  Musculoskeletal: Normal range of motion.  Lymphadenopathy:    She has no cervical adenopathy.  Neurological: She is alert and oriented to person, place, and time.  Skin: Skin is warm and dry.  Psychiatric: She has a normal mood and affect. Her behavior is normal.  Nursing note and vitals reviewed.   ED Course  Procedures   DIAGNOSTIC STUDIES: Oxygen Saturation is 98% on RA, normal by my interpretation.    COORDINATION OF CARE: 6:09 PM - Discussed treatment plan with pt at bedside.. Pt verbalized understanding and agreed to plan.   MDM   Final diagnoses:  Upper respiratory infection   Patients symptoms are consistent with URI, likely viral etiology. Discussed that antibiotics are not indicated for viral infections. Pt will be discharged with work note per pt request. Verbalizes understanding and is agreeable with plan. Pt is hemodynamically stable & in NAD prior to dc.   I personally performed the services described in this documentation, which was scribed in my presence. The recorded information has been reviewed and is accurate.    Melton Krebs, PA-C 07/07/15 2116  Cathren Laine, MD 07/10/15 [id number]

## 2015-07-05 NOTE — ED Notes (Signed)
Pt reports being "sick" for 3-4 days. She describes symptoms such as runny nose, dry cough, sneezing. Denies sore throat. She states the only way she came to ER to be evaluated today is because her work made her.

## 2015-07-05 NOTE — Discharge Instructions (Signed)
[name],  Nice meeting you! Please follow-up with your primary care provider. Return to the emergency department if you develop fevers, chills, shortness of breath, drooling and difficulty swallowing. Feel better soon!  S. Lane Hacker, PA-C  Upper Respiratory Infection, Adult Most upper respiratory infections (URIs) are a viral infection of the air passages leading to the lungs. A URI affects the nose, throat, and upper air passages. The most common type of URI is nasopharyngitis and is typically referred to as "the common cold." URIs run their course and usually go away on their own. Most of the time, a URI does not require medical attention, but sometimes a bacterial infection in the upper airways can follow a viral infection. This is called a secondary infection. Sinus and middle ear infections are common types of secondary upper respiratory infections. Bacterial pneumonia can also complicate a URI. A URI can worsen asthma and chronic obstructive pulmonary disease (COPD). Sometimes, these complications can require emergency medical care and may be life threatening.  CAUSES Almost all URIs are caused by viruses. A virus is a type of germ and can spread from one person to another.  RISKS FACTORS You may be at risk for a URI if:   You smoke.   You have chronic heart or lung disease.  You have a weakened defense (immune) system.   You are very young or very old.   You have nasal allergies or asthma.  You work in crowded or poorly ventilated areas.  You work in health care facilities or schools. SIGNS AND SYMPTOMS  Symptoms typically develop 2-3 days after you come in contact with a cold virus. Most viral URIs last 7-10 days. However, viral URIs from the influenza virus (flu virus) can last 14-18 days and are typically more severe. Symptoms may include:   Runny or stuffy (congested) nose.   Sneezing.   Cough.   Sore throat.   Headache.   Fatigue.    Fever.   Loss of appetite.   Pain in your forehead, behind your eyes, and over your cheekbones (sinus pain).  Muscle aches.  DIAGNOSIS  Your health care provider may diagnose a URI by:  Physical exam.  Tests to check that your symptoms are not due to another condition such as:  Strep throat.  Sinusitis.  Pneumonia.  Asthma. TREATMENT  A URI goes away on its own with time. It cannot be cured with medicines, but medicines may be prescribed or recommended to relieve symptoms. Medicines may help:  Reduce your fever.  Reduce your cough.  Relieve nasal congestion. HOME CARE INSTRUCTIONS   Take medicines only as directed by your health care provider.   Gargle warm saltwater or take cough drops to comfort your throat as directed by your health care provider.  Use a warm mist humidifier or inhale steam from a shower to increase air moisture. This may make it easier to breathe.  Drink enough fluid to keep your urine clear or pale yellow.   Eat soups and other clear broths and maintain good nutrition.   Rest as needed.   Return to work when your temperature has returned to normal or as your health care provider advises. You may need to stay home longer to avoid infecting others. You can also use a face mask and careful hand washing to prevent spread of the virus.  Increase the usage of your inhaler if you have asthma.   Do not use any tobacco products, including cigarettes, chewing tobacco,  or electronic cigarettes. If you need help quitting, ask your health care provider. PREVENTION  The best way to protect yourself from getting a cold is to practice good hygiene.   Avoid oral or hand contact with people with cold symptoms.   Wash your hands often if contact occurs.  There is no clear evidence that vitamin C, vitamin E, echinacea, or exercise reduces the chance of developing a cold. However, it is always recommended to get plenty of rest, exercise, and  practice good nutrition.  SEEK MEDICAL CARE IF:   You are getting worse rather than better.   Your symptoms are not controlled by medicine.   You have chills.  You have worsening shortness of breath.  You have brown or red mucus.  You have yellow or brown nasal discharge.  You have pain in your face, especially when you bend forward.  You have a fever.  You have swollen neck glands.  You have pain while swallowing.  You have white areas in the back of your throat. SEEK IMMEDIATE MEDICAL CARE IF:   You have severe or persistent:  Headache.  Ear pain.  Sinus pain.  Chest pain.  You have chronic lung disease and any of the following:  Wheezing.  Prolonged cough.  Coughing up blood.  A change in your usual mucus.  You have a stiff neck.  You have changes in your:  Vision.  Hearing.  Thinking.  Mood. MAKE SURE YOU:   Understand these instructions.  Will watch your condition.  Will get help right away if you are not doing well or get worse.   This information is not intended to replace advice given to you by your health care provider. Make sure you discuss any questions you have with your health care provider.   Document Released: 11/07/2000 Document Revised: 09/28/2014 Document Reviewed: 08/19/2013 Elsevier Interactive Patient Education [employer]

## 2015-10-03 ENCOUNTER — Encounter (HOSPITAL_BASED_OUTPATIENT_CLINIC_OR_DEPARTMENT_OTHER): Payer: Self-pay | Admitting: *Deleted

## 2015-10-03 ENCOUNTER — Emergency Department (HOSPITAL_BASED_OUTPATIENT_CLINIC_OR_DEPARTMENT_OTHER): Payer: BLUE CROSS/BLUE SHIELD

## 2015-10-03 ENCOUNTER — Emergency Department (HOSPITAL_BASED_OUTPATIENT_CLINIC_OR_DEPARTMENT_OTHER)
Admission: EM | Admit: 2015-10-03 | Discharge: 2015-10-03 | Disposition: A | Discharge status: Home / Self Care | Payer: BLUE CROSS/BLUE SHIELD | Attending: Emergency Medicine | Admitting: Emergency Medicine

## 2015-10-03 ENCOUNTER — Emergency Department (HOSPITAL_COMMUNITY)
Admission: EM | Admit: 2015-10-03 | Discharge: 2015-10-03 | Disposition: A | Discharge status: Home / Self Care | Payer: BLUE CROSS/BLUE SHIELD

## 2015-10-03 DIAGNOSIS — F17210 Nicotine dependence, cigarettes, uncomplicated: Secondary | ICD-10-CM | POA: Insufficient documentation

## 2015-10-03 DIAGNOSIS — N390 Urinary tract infection, site not specified: Secondary | ICD-10-CM | POA: Insufficient documentation

## 2015-10-03 DIAGNOSIS — R52 Pain, unspecified: Secondary | ICD-10-CM

## 2015-10-03 DIAGNOSIS — R1011 Right upper quadrant pain: Secondary | ICD-10-CM

## 2015-10-03 LAB — COMPREHENSIVE METABOLIC PANEL
ALT: 13 U/L — ABNORMAL LOW (ref 14–54)
AST: 18 U/L (ref 15–41)
Albumin: 4 g/dL (ref 3.5–5.0)
Alkaline Phosphatase: 42 U/L (ref 38–126)
Anion gap: 5 (ref 5–15)
BUN: 14 mg/dL (ref 6–20)
CHLORIDE: 107 mmol/L (ref 101–111)
CO2: 27 mmol/L (ref 22–32)
Calcium: 9.1 mg/dL (ref 8.9–10.3)
Creatinine, Ser: 0.95 mg/dL (ref 0.44–1.00)
GFR calc Af Amer: >60 mL/min (ref >60)
GFR calc non Af Amer: >60 mL/min (ref >60)
GLUCOSE: 95 mg/dL (ref 65–99)
POTASSIUM: 3.9 mmol/L (ref 3.5–5.1)
Sodium: 139 mmol/L (ref 135–145)
Total Bilirubin: 0.2 mg/dL — ABNORMAL LOW (ref 0.3–1.2)
Total Protein: 6.9 g/dL (ref 6.5–8.1)

## 2015-10-03 LAB — CBC WITH DIFFERENTIAL/PLATELET
BASOS ABS: 0 10*3/uL (ref 0.0–0.1)
BASOS PCT: 0 %
EOS PCT: 2 %
Eosinophils Absolute: 0.2 10*3/uL (ref 0.0–0.7)
HCT: 39.9 % (ref 36.0–46.0)
Hemoglobin: 13.1 g/dL (ref 12.0–15.0)
Lymphocytes Relative: 33 %
Lymphs Abs: 2.2 10*3/uL (ref 0.7–4.0)
MCH: 27 pg (ref 26.0–34.0)
MCHC: 32.8 g/dL (ref 30.0–36.0)
MCV: 82.1 fL (ref 78.0–100.0)
MONO ABS: 0.6 10*3/uL (ref 0.1–1.0)
Monocytes Relative: 8 %
NEUTROS ABS: 3.8 10*3/uL (ref 1.7–7.7)
Neutrophils Relative %: 57 %
PLATELETS: 348 10*3/uL (ref 150–400)
RBC: 4.86 MIL/uL (ref 3.87–5.11)
RDW: 13.8 % (ref 11.5–15.5)
WBC: 6.8 10*3/uL (ref 4.0–10.5)

## 2015-10-03 LAB — URINALYSIS, ROUTINE W REFLEX MICROSCOPIC
Bilirubin Urine: NEGATIVE
Glucose, UA: NEGATIVE mg/dL
Hgb urine dipstick: NEGATIVE
KETONES UR: NEGATIVE mg/dL
NITRITE: POSITIVE — AB
PROTEIN: NEGATIVE mg/dL
Specific Gravity, Urine: 1.025 (ref 1.005–1.030)
pH: 6 (ref 5.0–8.0)

## 2015-10-03 LAB — URINE MICROSCOPIC-ADD ON

## 2015-10-03 LAB — LIPASE, BLOOD: Lipase: 21 U/L (ref 11–51)

## 2015-10-03 LAB — PREGNANCY, URINE: Preg Test, Ur: NEGATIVE

## 2015-10-03 MED ORDER — CEPHALEXIN 500 MG PO CAPS: 500.0000 mg | ORAL_CAPSULE | Freq: Two times a day (BID) | ORAL | Status: DC

## 2015-10-03 NOTE — ED Provider Notes (Signed)
CSN: [id number]     Arrival date & time 10/03/15  1520 History   First MD Initiated Contact with Patient 10/03/15 1556     Chief Complaint  Patient presents with  . Abdominal Pain     (Consider location/radiation/quality/duration/timing/severity/associated sxs/prior Treatment) HPI   Blood pressure 111/76, pulse 81, temperature 98.3 F (36.8 C), temperature source Oral, resp. rate 18, height 5\' 7"  (1.702 m), weight 92.534 kg, last menstrual period 09/26/2015, SpO2 98 %.  Kelly Chan is a 25 y.o. female complaining of Right upper quadrant pain exacerbated postprandially and by certain positions onset 2 days ago, pain 6 out of 10, she's been taking bare aspirin with some relief. Patient denies nausea, vomiting, diarrhea, dysuria, hematuria, urinary frequency, abnormal vaginal discharge.   Past Medical History  Diagnosis Date  . Gonorrhea   . BV (bacterial vaginosis)   . Abnormal Pap smear 06/2011  . H/O candidiasis   . History of bacterial infection   . Yeast infection   . Pregnancy induced hypertension   . Chlamydia   . Bartholin cyst     w ward catheter   Past Surgical History  Procedure Laterality Date  . Wisdom tooth extraction    . Cesarean section  01/17/2012    Procedure: CESAREAN SECTION;  Surgeon: Esmeralda ArthurSandra A Rivard, MD;  Location: [facility];  Service: Obstetrics;  Laterality: N/A;  Priimary Cesarean Section Delivery Boy @0044 , Apgars 9/9   Family History  Problem Relation Age of Onset  . Cancer Maternal Aunt     Breast   . Cancer Maternal Aunt     Breast  . Hypertension Maternal Grandmother   . Other Neg Hx    Social History  Substance Use Topics  . Smoking status: Current Every Day Smoker -- 0.10 packs/day    Types: Cigarettes    Last Attempt to Quit: 05/25/2011  . Smokeless tobacco: Never Used     Comment: "I've quit because I can't tolerate the smell anymore."  . Alcohol Use: No   OB History    Gravida Para Term Preterm AB TAB SAB Ectopic Multiple  Living   1 1 1  0 0 0 0 0 0 1     Review of Systems  10 systems reviewed and found to be negative, except as noted in the HPI.  Allergies  Review of patient's allergies indicates no known allergies.  Home Medications   Prior to Admission medications   Medication Sig Start Date End Date Taking? Authorizing Provider  amoxicillin-clavulanate (AUGMENTIN) 875-125 MG per tablet Take 1 tablet by mouth every 12 (twelve) hours. 09/06/14   Jennifer Piepenbrink, PA-C  cephALEXin (KEFLEX) 500 MG capsule Take 1 capsule (500 mg total) by mouth 2 (two) times daily. 10/03/15   Felicita Nuncio, PA-C  clindamycin (CLEOCIN) 2 % vaginal cream Place 1 Applicatorful vaginally at bedtime. X 7 nights Patient not taking: Reported on 09/06/2014 06/05/14   Mathis FareJennifer Lee H Presson, PA  fluconazole (DIFLUCAN) 150 MG tablet Take 1 tablet (150 mg total) by mouth once. Pick up the refill and and take second dose in 5 days if symptoms have not resolved Patient not taking: Reported on 09/06/2014 05/08/14   Graylon GoodZachary H Baker, PA-C  fluconazole (DIFLUCAN) 150 MG tablet Take 1 tablet (150 mg total) by mouth once. 09/06/14   Jennifer Piepenbrink, PA-C  HYDROcodone-acetaminophen (NORCO/VICODIN) 5-325 MG per tablet Take 1-2 tablets by mouth every 4 (four) hours as needed. Patient not taking: Reported on 09/06/2014 03/02/14   Marlon Peliffany Greene, PA-C  metroNIDAZOLE (FLAGYL) 500 MG tablet Take 1 tablet (500 mg total) by mouth 2 (two) times daily. Patient not taking: Reported on 09/06/2014 03/02/14   Marlon Pel, PA-C  ondansetron (ZOFRAN) 4 MG tablet Take 1 tablet (4 mg total) by mouth every 6 (six) hours. Patient not taking: Reported on 09/06/2014 03/02/14   Marlon Pel, PA-C   BP 111/76 mmHg  Pulse 81  Temp(Src) 98.3 F (36.8 C) (Oral)  Resp 18  Ht 5\' 7"  (1.702 m)  Wt 92.534 kg  BMI 31.94 kg/m2  SpO2 98%  LMP 09/26/2015 Physical Exam  Constitutional: She is oriented to person, place, and time. She appears well-developed and  well-nourished. No distress.  HENT:  Head: Normocephalic and atraumatic.  Mouth/Throat: Oropharynx is clear and moist.  Eyes: Conjunctivae and EOM are normal. Pupils are equal, round, and reactive to light.  Neck: Normal range of motion.  Cardiovascular: Normal rate, regular rhythm and intact distal pulses.   Pulmonary/Chest: Effort normal and breath sounds normal. No stridor.  Abdominal: Soft. There is tenderness.  Mild  tenderness in the right upper quadrant with no guarding or rebound.  Musculoskeletal: Normal range of motion.  Neurological: She is alert and oriented to person, place, and time.  Skin: She is not diaphoretic.  Psychiatric: She has a normal mood and affect.  Nursing note and vitals reviewed.   ED Course  Procedures (including critical care time) Labs Review Labs Reviewed  URINALYSIS, ROUTINE W REFLEX MICROSCOPIC (NOT AT [facility]) - Abnormal; Notable for the following:    Nitrite POSITIVE (*)    Leukocytes, UA SMALL (*)    All other components within normal limits  URINE MICROSCOPIC-ADD ON - Abnormal; Notable for the following:    Squamous Epithelial / LPF 0-5 (*)    Bacteria, UA MANY (*)    All other components within normal limits  COMPREHENSIVE METABOLIC PANEL - Abnormal; Notable for the following:    ALT 13 (*)    Total Bilirubin 0.2 (*)    All other components within normal limits  PREGNANCY, URINE  CBC WITH DIFFERENTIAL/PLATELET  LIPASE, BLOOD    Imaging Review US Abdomen Limited Ruq  10/03/2015  CLINICAL DATA:  Right upper quadrant pain EXAM: US ABDOMEN LIMITED - RIGHT UPPER QUADRANT COMPARISON:  None. FINDINGS: Gallbladder: The gallbladder wall is thickened measuring 5 mm. Filling defect within the lumen of the gallbladder is identified measuring 6 mm. Sludge within the gallbladder noted. Common bile duct: Diameter: 2 mm Liver: No focal lesion identified. Within normal limits in parenchymal echogenicity. IMPRESSION: 1. Gallbladder wall thickening. If there  is a high clinical concern for acute cholecystitis and further imaging is clinically indicated a nuclear medicine hepatobiliary scan may be helpful to assess patency of the cystic duct. 2. There is a filling defect within the lumen of the gallbladder measuring 6 mm. This may reflect a noncalcified gallstone, polyp or sludge ball. Electronically Signed   By: Signa Kell M.D.   On: 10/03/2015 18:21   I have personally reviewed and evaluated these images and lab results as part of my medical decision-making.   EKG Interpretation None      MDM   Final diagnoses:  Pain  RUQ pain  UTI (lower urinary tract infection)    Filed Vitals:   10/03/15 1528  BP: 111/76  Pulse: 81  Temp: 98.3 F (36.8 C)  TempSrc: Oral  Resp: 18  Height: 5\' 7"  (1.702 m)  Weight: 92.534 kg  SpO2: 98%    Kelly Chan is 25 y.o. female presenting withRight upper quadrant pain intermittently over the last 2 days, states she is exacerbated postprandially. No fever, nausea, vomiting, mild tenderness palpation the right upper quadrant with no guarding or rebound. Patient has urinalysis consistent with UTI however she has no symptoms, asymptomatic bacteriuria, will not treat. Will obtain basic blood work and ultrasound of right upper quadrant. Patient declines pain medication.  Blood work with no significant abnormality. Right upper quadrant ultrasound with gallbladder wall thickening to 5 mm, there is a filling defect in the lumen of the gallbladder measuring 6 mm and sluggish. Common bile duct 2 mm. Given the fact that this patient is eating and drinking normally, no fever, no significant vital sign abnormality, no leukocytosis and normal LFTs, I doubt this is acute cholecystitis. Repeat abdominal exam also remains benign. We have had an extensive discussion of return precautions and patient verbalized her understanding, at discharge patient states that her urine has had a slightly more foul-smelling than normal,  will treat for UTI with Keflex.  Discussed case with attending physician who agrees with care plan and disposition.   Evaluation does not show pathology that would require ongoing emergent intervention or inpatient treatment. Pt is hemodynamically stable and mentating appropriately. Discussed findings and plan with patient/guardian, who agrees with care plan. All questions answered. Return precautions discussed and outpatient follow up given.   New Prescriptions   CEPHALEXIN (KEFLEX) 500 MG CAPSULE    Take 1 capsule (500 mg total) by mouth 2 (two) times daily.         Wynetta Emery, PA-C 10/03/15 1850  Geoffery Lyons, MD 10/03/15 [phone]

## 2015-10-03 NOTE — ED Notes (Signed)
Pt left stated she went to urgent care.

## 2015-10-03 NOTE — ED Notes (Signed)
Went to round on pt and pt not currently in room, belongings to bedside still.

## 2015-10-03 NOTE — ED Notes (Signed)
Right upper quadrant pain x 2 days.

## 2015-10-03 NOTE — Discharge Instructions (Signed)
If your pain becomes more severe, if you develop vomiting or fever please return immediately to the emergency room  Take acetaminophen (Tylenol) up to 975 mg (this is normally 3 over-the-counter pills) up to 3 times a day. Do not drink alcohol. Make sure your other medications do not contain acetaminophen (Read the labels!)  Do not hesitate to return to the emergency room for any new, worsening or concerning symptoms.  Please obtain primary care using resource guide below. Let them know that you were seen in the emergency room and that they will need to obtain records for further outpatient management.     Biliary Colic Biliary colic is a pain in the upper abdomen. The pain:  Is usually felt on the right side of the abdomen, but it may also be felt in the center of the abdomen, just below the breastbone (sternum).  May spread back toward the right shoulder blade.  May be steady or irregular.  May be accompanied by nausea and vomiting. Most of the time, the pain goes away in 1-5 hours. After the most intense pain passes, the abdomen may continue to ache mildly for about 24 hours. Biliary colic is caused by a blockage in the bile duct. The bile duct is a pathway that carries bile--a liquid that helps to digest fats--from the gallbladder to the small intestine. Biliary colic usually occurs after eating, when the digestive system demands bile. The pain develops when muscle cells contract forcefully to try to move the blockage so that bile can get by. HOME CARE INSTRUCTIONS  Take medicines only as directed by your health care provider.  Drink enough fluid to keep your urine clear or pale yellow.  Avoid fatty, greasy, and fried foods. These kinds of foods increase your body's demand for bile.  Avoid any foods that make your pain worse.  Avoid overeating.  Avoid having a large meal after fasting. SEEK MEDICAL CARE IF:  You develop a fever.  Your pain gets worse.  You vomit.  You  develop nausea that prevents you from eating and drinking. SEEK IMMEDIATE MEDICAL CARE IF:  You suddenly develop a fever and shaking chills.  You develop a yellowish discoloration (jaundice) of:  Skin.  Whites of the eyes.  Mucous membranes.  You have continuous or severe pain that is not relieved with medicines.  You have nausea and vomiting that is not relieved with medicines.  You develop dizziness or you faint.   This information is not intended to replace advice given to you by your health care provider. Make sure you discuss any questions you have with your health care provider.   Document Released: 10/15/2005 Document Revised: 09/28/2014 Document Reviewed: 02/23/2014 Elsevier Interactive Patient Education 2016 [employer] Resource Guide Financial Assistance The United Ways 211 is a great source of information about community services available.  Access by dialing 2-1-1 from anywhere in [place], or by website -  PooledIncome.plwww.nc211.org.   Other Local Resources (Updated 05/2015)  Financial Assistance   Services    Phone Number and Address  [facility]  Low-cost medical care - 1st and 3rd Saturday of every month  Must not qualify for public or private insurance and must have limited income [phone] [address] [place], [place]    [place] [employer] [place] [employer] of Social Services  Child care  Emergency assistance for housing and [employer]  Food stamps  Medicaid [phone] [address] [place], [place] [id number]   [facility]  Low-cost medical care for children, communicable diseases, sexually-transmitted diseases, immunizations, maternity care, womens health and family planning [phone] [address] [place], [place] [address]  [facility]   Medication assistance for [facility] residents  Must meet income requirements  [phone] [place], [place].    [place]  Child care  Emergency assistance for housing and [employer]  Medicaid [phone] [address] [place], [place] [address]  [facility] [place]   Low-cost medical care,   Monday through Friday, 9 am to 6 pm.   Accepts Medicare/Medicaid, and self-pay [phone] [address] [place], [place] [address]  [facility] for [place] care - Monday through Friday, 8:30 am - 5:30 pm  Accepts Medicaid and self-pay [phone] [address] [place], [place] [address]   [facility]  Primary medical care, including for those with sickle cell disease  Accepts Medicare, Medicaid, insurance and self-pay [phone] [place], [place]  [facility]   Primary medical care  Accepts Medicare, [place], insurance and self-pay [phone] [address], [place], [place] [address]   [facility] Department of Social Services  Child care  Emergency assistance for housing and [employer]  Medicaid [phone] [address] [place], [place] [address]  [place] Department of Health and [employer]  Child care  Emergency assistance for housing and [employer]  Medicaid [phone] [place], [place] [address]   [facility] Medication Assistance Program  Medication assistance for [facility] residents with no insurance only  Must have a primary care doctor [phone], [place], [place]  [facility]   Primary medical care  [place], [place], insurance  [phone] [place], [place]  [facility]   Medication assistance [phone]  [facility]   Primary medical care  Accepts Medicare, [place],  insurance and self-pay [phone] [address] [place], [place] [address]  [facility]   Primary medical care  Accepts Medicare, [place], insurance and self-pay [phone] [address] [place], [place] [address]  [facility]  For [place] residents between the ages of 50 and 62 who do not have any form of health insurance, Medicare, [place], or [place] benefits.  Services are provided free of charge to uninsured patients who fall within federal poverty guidelines.    Hours: Tuesdays and Thursdays, 4:15 - 8 pm [phone] [address], [place], [place] [address]  [facility]     Primary medical care  Dental care  Nutritional counseling  Pharmacy  Accepts Medicaid, Medicare, most insurance.  Fees are adjusted based on ability to pay.   [place], [place] [address] [place], [place], [place] [facility], [place], [place] [facility] [place], [place]  [facility]  Womens health and family planning [phone] [place] [place], [place] care  Emergency assistance for housing and [employer]  Medicaid [phone] [address] [place],  [address]   [place]  Ages 51 and older  Hours: Mondays and Thursdays, 7:00 am - 9:00 am Patients are seen on a first come, first served basis. [phone], ext. [phone] [place], [place]  [facility] Division of Social Services  Child care  Emergency assistance for housing and [employer]  Medicaid [phone] [address], [place]  [address]  [facility]  Medication assistance  Rental assistance  Food pantry  Medication assistance  Housing assistance  Emergency food distribution  Utility assistance [phone] [address] [place], [place]  [phone]  [address] [place], [place] [address] Hours: Tuesdays and Thursdays from 9am - 12 noon by appointment only  [phone] [address] [place], [place] [address]  [facility]   Accepts private insurance, [place], and [place].  Payment is based on a sliding scale for those without insurance.  Hours: Mondays, Tuesdays and Thursdays, 8:30 am - 5:30 pm.   [phone] [address] [place], [place]  [facility] - Family Medicine at [facility], [place], and [place].  Payment is based on a sliding scale for those without insurance. [phone] [address] [place], [place]  [facility] - Pediatrics at E. Scientist, research (physical sciences), [employer], and [place].  Payment is based on a sliding scale for those without insurance [phone] [address], [employer], [place]  [facility] - Pediatrics at [employer], [place], and [place].  Payment is based on a sliding scale for those without insurance. [phone] [address] [place], [place]  [facility] - Pediatrics at [facility], [place], and [place].  Payment is based on a sliding scale for those without insurance. [phone], ext. [phone] [place], [place].    [facility]  Maternity care.  Accepts Medicaid and self-pay. [phone] [address] [place], [place]

## 2015-10-03 NOTE — ED Notes (Signed)
PA at bedside.

## 2015-10-06 LAB — URINE CULTURE

## 2015-10-07 ENCOUNTER — Telehealth: Payer: Self-pay | Admitting: *Deleted

## 2015-10-07 NOTE — ED Notes (Signed)
Post ED Visit - Positive Culture Follow-up: Successful Patient Follow-Up  Culture assessed and recommendations reviewed by: []  Enzo BiNathan Batchelder, Pharm.D. []  Celedonio MiyamotoJeremy Frens, Pharm.D., BCPS []  Garvin FilaMike Maccia, Pharm.D. [x]  Georgina PillionElizabeth Martin, [address]Ph[address]D., BCPS []  ColdfootMinh Pham, [address]Pharm[address]D., BCPS, AAHIVP []  Estella HuskMichelle Turner, Pharm.D., BCPS, AAHIVP []  Tennis Mustassie Stewart, Pharm.D. []  Sherle Poeob Vincent, [place]  Positive urine culture  []  Patient discharged without antimicrobial prescription and treatment is now indicated []  Organism is resistant to prescribed ED discharge antimicrobial []  Patient with positive blood cultures  Changes discussed with ED provider:  Audry Piliyler Mohr, PA-C D/C Keflex, no further treatment necessary  Contacted patient, date 10/07/2015, time 1051   Lysle PearlRobertson, Analyn Matusek Talley 10/07/2015, 10:49 AM

## 2015-10-07 NOTE — Progress Notes (Signed)
ED Antimicrobial Stewardship Positive Culture Follow Up   Kelly GuntherChannay E Chan is an 25 y.o. female who [facility]2015 with a chief complaint of RUQ pain  Recent Results (from the past 720 hour(s))  Urine culture     Status: Abnormal   Collection Time: 10/03/15  3:35 PM  Result Value Ref Range Status   Specimen Description URINE, CLEAN CATCH  Final   Special Requests NONE  Final   Culture (A)  Final    >=100,000 COLONIES/mL ESCHERICHIA COLI Confirmed Extended Spectrum Beta-Lactamase Producer (ESBL) Performed [facility]    Report Status 10/06/2015 FINAL  Final   Organism ID, Bacteria ESCHERICHIA COLI (A)  Final      Susceptibility   Escherichia coli - MIC*    AMPICILLIN >=32 RESISTANT Resistant     CEFAZOLIN >=64 RESISTANT Resistant     CEFTRIAXONE >=64 RESISTANT Resistant     CIPROFLOXACIN 1 SENSITIVE Sensitive     GENTAMICIN <=1 SENSITIVE Sensitive     IMIPENEM <=0.25 SENSITIVE Sensitive     NITROFURANTOIN <=16 SENSITIVE Sensitive     TRIMETH/SULFA >=320 RESISTANT Resistant     AMPICILLIN/SULBACTAM >=32 RESISTANT Resistant     PIP/TAZO 8 SENSITIVE Sensitive     * >=100,000 COLONIES/mL ESCHERICHIA COLI    [x]  Treated with Keflex, organism resistant to prescribed antimicrobial []  Patient discharged originally without antimicrobial agent and treatment is now indicated  New antibiotic prescription: No treatment needed - pt without urinary complaints  ED Provider: Audry Piliyler Mohr, PA-C  Rolley SimsMartin, Pearline Yerby Ann 10/07/2015, 9:04 AM Infectious Diseases Pharmacist Phone# [phone]

## 2016-01-13 ENCOUNTER — Emergency Department (HOSPITAL_BASED_OUTPATIENT_CLINIC_OR_DEPARTMENT_OTHER)
Admission: EM | Admit: 2016-01-13 | Discharge: 2016-01-13 | Disposition: A | Discharge status: Home / Self Care | Payer: Medicaid Other | Attending: Emergency Medicine | Admitting: Emergency Medicine

## 2016-01-13 ENCOUNTER — Encounter (HOSPITAL_BASED_OUTPATIENT_CLINIC_OR_DEPARTMENT_OTHER): Payer: Self-pay | Admitting: Emergency Medicine

## 2016-01-13 DIAGNOSIS — F17210 Nicotine dependence, cigarettes, uncomplicated: Secondary | ICD-10-CM | POA: Diagnosis not present

## 2016-01-13 DIAGNOSIS — R1032 Left lower quadrant pain: Secondary | ICD-10-CM | POA: Diagnosis not present

## 2016-01-13 DIAGNOSIS — R109 Unspecified abdominal pain: Secondary | ICD-10-CM

## 2016-01-13 LAB — CBC WITH DIFFERENTIAL/PLATELET
BASOS ABS: 0 10*3/uL (ref 0.0–0.1)
BASOS PCT: 0 %
EOS ABS: 0.1 10*3/uL (ref 0.0–0.7)
EOS PCT: 3 %
HCT: 37.7 % (ref 36.0–46.0)
Hemoglobin: 12.5 g/dL (ref 12.0–15.0)
LYMPHS PCT: 34 %
Lymphs Abs: 1.8 10*3/uL (ref 0.7–4.0)
MCH: 27.1 pg (ref 26.0–34.0)
MCHC: 33.2 g/dL (ref 30.0–36.0)
MCV: 81.6 fL (ref 78.0–100.0)
Monocytes Absolute: 0.5 10*3/uL (ref 0.1–1.0)
Monocytes Relative: 9 %
Neutro Abs: 2.9 10*3/uL (ref 1.7–7.7)
Neutrophils Relative %: 54 %
PLATELETS: 316 10*3/uL (ref 150–400)
RBC: 4.62 MIL/uL (ref 3.87–5.11)
RDW: 13.9 % (ref 11.5–15.5)
WBC: 5.2 10*3/uL (ref 4.0–10.5)

## 2016-01-13 LAB — COMPREHENSIVE METABOLIC PANEL
ALBUMIN: 3.9 g/dL (ref 3.5–5.0)
ALT: 15 U/L (ref 14–54)
ANION GAP: 6 (ref 5–15)
AST: 19 U/L (ref 15–41)
Alkaline Phosphatase: 46 U/L (ref 38–126)
BUN: 14 mg/dL (ref 6–20)
CALCIUM: 9 mg/dL (ref 8.9–10.3)
CO2: 26 mmol/L (ref 22–32)
CREATININE: 0.8 mg/dL (ref 0.44–1.00)
Chloride: 106 mmol/L (ref 101–111)
GFR calc Af Amer: >60 mL/min (ref >60)
GFR calc non Af Amer: >60 mL/min (ref >60)
GLUCOSE: 106 mg/dL — AB (ref 65–99)
Potassium: 3.3 mmol/L — ABNORMAL LOW (ref 3.5–5.1)
SODIUM: 138 mmol/L (ref 135–145)
TOTAL PROTEIN: 6.9 g/dL (ref 6.5–8.1)
Total Bilirubin: 0.3 mg/dL (ref 0.3–1.2)

## 2016-01-13 LAB — URINALYSIS, ROUTINE W REFLEX MICROSCOPIC
BILIRUBIN URINE: NEGATIVE
Glucose, UA: NEGATIVE mg/dL
HGB URINE DIPSTICK: NEGATIVE
KETONES UR: NEGATIVE mg/dL
Leukocytes, UA: NEGATIVE
Nitrite: NEGATIVE
PROTEIN: NEGATIVE mg/dL
Specific Gravity, Urine: 1.025 (ref 1.005–1.030)
pH: 6 (ref 5.0–8.0)

## 2016-01-13 LAB — PREGNANCY, URINE: Preg Test, Ur: NEGATIVE

## 2016-01-13 LAB — LIPASE, BLOOD: Lipase: 19 U/L (ref 11–51)

## 2016-01-13 MED ORDER — METHOCARBAMOL 500 MG PO TABS
500.0000 mg | ORAL_TABLET | Freq: Once | ORAL | Status: AC
  Administered 2016-01-13: 500 mg via ORAL
  Filled 2016-01-13: qty 1

## 2016-01-13 MED ORDER — METHOCARBAMOL 500 MG PO TABS: 500.0000 mg | ORAL_TABLET | Freq: Two times a day (BID) | ORAL | 0 refills | Status: DC

## 2016-01-13 MED ORDER — KETOROLAC TROMETHAMINE 30 MG/ML IJ SOLN
30.0000 mg | Freq: Once | INTRAMUSCULAR | Status: DC
  Filled 2016-01-13: qty 1

## 2016-01-13 MED ORDER — SODIUM CHLORIDE 0.9 % IV BOLUS (SEPSIS): 1000.0000 mL | Freq: Once | INTRAVENOUS | Status: DC

## 2016-01-13 MED ORDER — KETOROLAC TROMETHAMINE 10 MG PO TABS: 10.0000 mg | ORAL_TABLET | Freq: Four times a day (QID) | ORAL | 0 refills | Status: DC | PRN

## 2016-01-13 MED ORDER — KETOROLAC TROMETHAMINE 60 MG/2ML IM SOLN
30.0000 mg | Freq: Once | INTRAMUSCULAR | Status: AC
  Administered 2016-01-13: 30 mg via INTRAMUSCULAR

## 2016-01-13 NOTE — ED Triage Notes (Signed)
Patient states that she is having pain to her left flank and lower left side x weeks. The patient reports intermittent Nausea and dizziness but just finished her period and thought it was that

## 2016-01-13 NOTE — ED Notes (Signed)
Pt verbalizes understanding of d/c instructions and denies any further needs at this time. 

## 2016-01-13 NOTE — ED Notes (Signed)
Attempted IV twice without success  

## 2016-01-13 NOTE — Discharge Instructions (Signed)
°  Do not combine ketorolac (toradol) with any other NSAID (motrin, ibuprofen, Advil, aleve , aspirin, naproxen etc.) Take fist ketorolac pill tomorrow, you have already had a shot today   For breakthrough pain you may take Robaxin. Do not drink alcohol, drive or operate heavy machinery when taking Robaxin.  Do not hesitate to return to the emergency room for any new, worsening or concerning symptoms.  Please obtain primary care using resource guide below. Let them know that you were seen in the emergency room and that they will need to obtain records for further outpatient management.

## 2016-01-13 NOTE — ED Provider Notes (Signed)
[facility] Provider Note   CSN: [id number] Arrival date & time: 01/13/16  1748  By signing my name below, I, Kelly Chan, attest that this documentation has been prepared under the direction and in the presence of non-physician practitioner, Wynetta EmeryNicole Bobbie Valletta, PA-C. Electronically Signed: Linna Darnerussell Chan, Scribe. 01/13/2016. 7:19 PM.  History   Chief Complaint Chief Complaint  Patient presents with  . Flank Pain    The history is provided by the patient. No language interpreter was used.     HPI Comments: Kelly Chan is a 25 y.o. female who presents to the Emergency Department complaining of sudden onset, constant, left flank pain beginning about a week ago. She states the pain intermittently radiates into her mid-back, left abdomen, and left ribs. She endorses pain exacerbation with breathing and with certain positions. Pt also notes a mild cough and some SOB due to pain since onset. Pt has taken Tylenol with no relief of her pain. She notes she has been eating normally since onset. She denies recent heavy lifting, hormonal birth control use, h/o blood clot in legs or lungs, recent long travel, or h/o cancer. She further denies leg swelling, fever, frequency, dysuria, or any other associated symptoms.  Past Medical History:  Diagnosis Date  . Abnormal Pap smear 06/2011  . Bartholin cyst    w ward catheter  . BV (bacterial vaginosis)   . Chlamydia   . Gonorrhea   . H/O candidiasis   . History of bacterial infection   . Pregnancy induced hypertension   . Yeast infection     Patient Active Problem List   Diagnosis Date Noted  . Status post primary low transverse cesarean section 01/19/2012  . Gestational hypertension 01/16/2012  . Pain of round ligament 01/01/2012  . Marijuana use 12/06/2011  . History of smoking 12/06/2011  . Prenatal care, first pregnancy 12/06/2011  . Echogenic focus of heart of fetus affecting antepartum care of mother 10/02/2011  .  Ovarian cyst, left 08/17/2011  . Hx of gonorrhea 08/17/2011  . Hx of chlamydia infection 08/17/2011    Past Surgical History:  Procedure Laterality Date  . CESAREAN SECTION  01/17/2012   Procedure: CESAREAN SECTION;  Surgeon: Esmeralda ArthurSandra A Rivard, MD;  Location: [facility];  Service: Obstetrics;  Laterality: N/A;  Priimary Cesarean Section Delivery Boy @0044 , Apgars 9/9  . WISDOM TOOTH EXTRACTION      OB History    Gravida Para Term Preterm AB Living   1 1 1  0 0 1   SAB TAB Ectopic Multiple Live Births   0 0 0 0 1       Home Medications    Prior to Admission medications   Medication Sig Start Date End Date Taking? Authorizing Provider  amoxicillin-clavulanate (AUGMENTIN) 875-125 MG per tablet Take 1 tablet by mouth every 12 (twelve) hours. 09/06/14   Jennifer Piepenbrink, PA-C  cephALEXin (KEFLEX) 500 MG capsule Take 1 capsule (500 mg total) by mouth 2 (two) times daily. 10/03/15   Maleeha Halls, PA-C  clindamycin (CLEOCIN) 2 % vaginal cream Place 1 Applicatorful vaginally at bedtime. X 7 nights Patient not taking: Reported on 09/06/2014 06/05/14   Mathis FareJennifer Lee H Presson, PA  fluconazole (DIFLUCAN) 150 MG tablet Take 1 tablet (150 mg total) by mouth once. Pick up the refill and and take second dose in 5 days if symptoms have not resolved Patient not taking: Reported on 09/06/2014 05/08/14   Graylon GoodZachary H Baker, PA-C  fluconazole (DIFLUCAN) 150 MG tablet Take 1  tablet (150 mg total) by mouth once. 09/06/14   Jennifer Piepenbrink, PA-C  HYDROcodone-acetaminophen (NORCO/VICODIN) 5-325 MG per tablet Take 1-2 tablets by mouth every 4 (four) hours as needed. Patient not taking: Reported on 09/06/2014 03/02/14   Marlon Peliffany Greene, PA-C  metroNIDAZOLE (FLAGYL) 500 MG tablet Take 1 tablet (500 mg total) by mouth 2 (two) times daily. Patient not taking: Reported on 09/06/2014 03/02/14   Marlon Peliffany Greene, PA-C  ondansetron (ZOFRAN) 4 MG tablet Take 1 tablet (4 mg total) by mouth every 6 (six) hours. Patient not  taking: Reported on 09/06/2014 03/02/14   Marlon Peliffany Greene, PA-C    Family History Family History  Problem Relation Age of Onset  . Cancer Maternal Aunt     Breast   . Cancer Maternal Aunt     Breast  . Hypertension Maternal Grandmother   . Other Neg Hx     Social History Social History  Substance Use Topics  . Smoking status: Current Every Day Smoker    Packs/day: 0.10    Types: Cigarettes    Last attempt to quit: 05/25/2011  . Smokeless tobacco: Never Used     Comment: "I've quit because I can't tolerate the smell anymore."  . Alcohol use No     Allergies   Review of patient's allergies indicates no known allergies.   Review of Systems Review of Systems   A complete 10 system review of systems was obtained and all systems are negative except as noted in the HPI and PMH.   Physical Exam Updated Vital Signs BP 100/77 (BP Location: Right Arm)   Pulse 89   Temp 98.4 F (36.9 C) (Oral)   Resp 18   Ht 5\' 7"  (1.702 m)   Wt 194 lb (88 kg)   LMP 01/11/2016   SpO2 100%   BMI 30.38 kg/m   Physical Exam  Constitutional: She is oriented to person, place, and time. She appears well-developed and well-nourished. No distress.  HENT:  Head: Normocephalic and atraumatic.  Mouth/Throat: Oropharynx is clear and moist.  Eyes: Conjunctivae and EOM are normal. Pupils are equal, round, and reactive to light.  Neck: Normal range of motion. Neck supple. No JVD present. No tracheal deviation present.  Cardiovascular: Normal rate, regular rhythm and intact distal pulses.   Radial pulse equal bilaterally  Pulmonary/Chest: Effort normal and breath sounds normal. No stridor. No respiratory distress. She has no wheezes. She has no rales. She exhibits no tenderness.  Abdominal: Soft. She exhibits no distension and no mass. There is no tenderness. There is no rebound and no guarding.  Musculoskeletal: Normal range of motion. She exhibits no edema or tenderness.  No calf asymmetry,  superficial collaterals, palpable cords, edema, Homans sign negative bilaterally.    Neurological: She is alert and oriented to person, place, and time.  Skin: Skin is warm and dry. Capillary refill takes less than 2 seconds. She is not diaphoretic.  Psychiatric: She has a normal mood and affect. Her behavior is normal.  Nursing note and vitals reviewed.   ED Treatments / Results  Labs (all labs ordered are listed, but only abnormal results are displayed) Labs Reviewed  PREGNANCY, URINE  URINALYSIS, ROUTINE W REFLEX MICROSCOPIC (NOT AT [facility])    EKG  EKG Interpretation None       Radiology No results found.  Procedures Procedures (including critical care time)  DIAGNOSTIC STUDIES: Oxygen Saturation is 100% on RA, normal by my interpretation.    COORDINATION OF CARE: 7:19 PM Discussed treatment  plan with pt at bedside and pt agreed to plan.  Medications Ordered in ED Medications - No data to display   Initial Impression / Assessment and Plan / ED Course  I have reviewed the triage vital signs and the nursing notes.  Pertinent labs & imaging results that were available during my care of the patient were reviewed by me and considered in my medical decision making (see chart for details).  Clinical Course    Vitals:   01/13/16 1757 01/13/16 1956  BP: 100/77 105/67  Pulse: 89 74  Resp: 18 16  Temp: 98.4 F (36.9 C)   TempSrc: Oral   SpO2: 100% 100%  Weight: 88 kg   Height: 5\' 7"  (1.702 m)     Medications  ketorolac (TORADOL) injection 30 mg (30 mg Intramuscular Given 01/13/16 1959)  methocarbamol (ROBAXIN) tablet 500 mg (500 mg Oral Given 01/13/16 2156)    CLARIECE ROESLER is 25 y.o. female presenting with Left flank pain onset 1 week ago it radiates around to the left upper abdomen. Abdominal exam is benign.. She states that it is pleuritic in nature. Lung sounds are clear to auscultation, patient is afebrile she's not really reporting cough, pain is not  reproducible. Patient is not tachypneic or tachycardic. She does not take any birth control medications. Patient is low risk by Wells criteria and PERC negative. Will check basic blood work and give Toradol for presumed costochondritis.  Blood work with no significant abnormality, vital signs have remained stable in the ED. He thinks is likely superficial and musculoskeletal in nature.  Evaluation does not show pathology that would require ongoing emergent intervention or inpatient treatment. Pt is hemodynamically stable and mentating appropriately. Discussed findings and plan with patient/guardian, who agrees with care plan. All questions answered. Return precautions discussed and outpatient follow up given.    I personally performed the services described in this documentation, which was scribed in my presence. The recorded information has been reviewed and is accurate.   Final Clinical Impressions(s) / ED Diagnoses   Final diagnoses:  Acute left flank pain    New Prescriptions Discharge Medication List as of 01/13/2016  9:41 PM    START taking these medications   Details  ketorolac (TORADOL) 10 MG tablet Take 1 tablet (10 mg total) by mouth every 6 (six) hours as needed (Take with food. Do not take more than 4 per day. Do not take for longer than 5 days)., Starting Fri 01/13/2016, Print    methocarbamol (ROBAXIN) 500 MG tablet Take 1 tablet (500 mg total) by mouth 2 (two) times daily., Starting Fri 01/13/2016, Print         Charlo Afua Hoots, PA-C 01/13/16 8119    Maia Plan, MD 01/14/16 [phone]

## 2017-04-02 ENCOUNTER — Ambulatory Visit: Payer: MEDICAID

## 2017-04-02 DIAGNOSIS — N898 Other specified noninflammatory disorders of vagina: Secondary | ICD-10-CM

## 2017-04-03 ENCOUNTER — Ambulatory Visit: Payer: MEDICAID

## 2017-04-03 DIAGNOSIS — B9689 Other specified bacterial agents as the cause of diseases classified elsewhere: Secondary | ICD-10-CM

## 2017-04-03 DIAGNOSIS — N760 Acute vaginitis: Secondary | ICD-10-CM

## 2017-04-03 LAB — Trichomonas vaginalis Antigen: TRICHOMONAS VAGINALIS ANTIGEN: NEGATIVE

## 2017-04-03 LAB — Bacterial Vaginosis Screen

## 2017-04-03 LAB — Fungal Stain: FUNGAL STAIN_FIRST: NONE SEEN

## 2017-04-03 MED ORDER — METRONIDAZOLE 500 MG PO TABS
500 mg | ORAL_TABLET | Freq: Two times a day (BID) | ORAL | 0 refills | Status: AC
Start: 2017-04-03 — End: ?

## 2017-04-03 NOTE — Patient Instructions
Take a picture of your form and send it to me via the online [facility] portal. I will then order the x-rays for you. Come back anytime and have them done here in clinic. Once they result, I will fax over the results to your chiropractor. When you see him tomorrow, please get the fax number and send it to me.    I will release the results of your test(s) to your [facility] online portal
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
## 2017-04-04 LAB — Chlamydia trachomatis PCR

## 2017-04-04 LAB — Neisseria gonorrhoeae PCR

## 2017-04-17 ENCOUNTER — Ambulatory Visit: Payer: MEDICAID

## 2017-04-17 DIAGNOSIS — S46012A Strain of muscle(s) and tendon(s) of the rotator cuff of left shoulder, initial encounter: Secondary | ICD-10-CM

## 2017-04-17 DIAGNOSIS — J3089 Other allergic rhinitis: Secondary | ICD-10-CM

## 2017-04-17 DIAGNOSIS — J069 Acute upper respiratory infection, unspecified: Secondary | ICD-10-CM

## 2017-04-17 DIAGNOSIS — H6122 Impacted cerumen, left ear: Secondary | ICD-10-CM

## 2017-04-17 DIAGNOSIS — M542 Cervicalgia: Secondary | ICD-10-CM

## 2017-04-17 DIAGNOSIS — S46812A Strain of other muscles, fascia and tendons at shoulder and upper arm level, left arm, initial encounter: Secondary | ICD-10-CM

## 2017-04-17 DIAGNOSIS — G2589 Other specified extrapyramidal and movement disorders: Secondary | ICD-10-CM

## 2017-04-17 DIAGNOSIS — R0982 Postnasal drip: Secondary | ICD-10-CM

## 2017-04-17 DIAGNOSIS — R05 Cough: Secondary | ICD-10-CM

## 2017-04-18 MED ORDER — FLUTICASONE PROPIONATE 50 MCG/ACT NA SUSP
1 | Freq: Two times a day (BID) | NASAL | 1 refills | Status: AC
Start: 2017-04-18 — End: ?

## 2017-04-18 MED ORDER — BENZONATATE 100 MG PO CAPS
100 mg | ORAL_CAPSULE | Freq: Three times a day (TID) | ORAL | 0 refills | Status: AC | PRN
Start: 2017-04-18 — End: ?

## 2017-04-18 NOTE — Patient Instructions
Xrays today -- I will send you results  Start home stretches and exercises   Referral to physical therapy   Continue chiropractor care, but please avoid aggressive neck adjustment   Heating pad for comfort   Nasal saline rinses, followed by Flonase  Good fluid hydration  Benzonatate cough medication as needed

## 2017-04-18 NOTE — Nursing Note
NURSING NOTE FOR EAR LAVAGE    []  Bilateral  [x]  Left   []  Right    [x]   Explained procedure to patient   [x]  Inspected ear(s)   [x]  Irrigated ears(s) w/Hydrogen Peroxide & H20   [x]  Cerumen disimpaction     []  Partial     []  Complete   [x]  No redness/irritation or bleeding noted   [x]  Patient denies pain/discomfort during procedure   [x]  MD notified after procedure   []  Other:      Teresa Moody tolerated the procedure well with no signs or symptoms of adverse reaction.     I have completed the physician's order for ear lavage on 04/17/2017.  Marda Stalkerodriguez, Oren M., MD has been notified of the results. -------- Teresa Moody
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
## 2017-04-24 ENCOUNTER — Telehealth: Payer: MEDICAID

## 2017-04-24 NOTE — Telephone Encounter
Sent paperwork to attorneys office to get referrals authorized for PT as well as [facility] Medicine. Per Myrlene Brokeralejandra, paperwork [facility] who handles medical aspects of patients case, she is working on paperwork, I provided my info for Maxine Glennmonica to reach out once she need any additional info from me.

## 2017-04-24 NOTE — Telephone Encounter
Called patient to give her update.
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
## 2017-05-14 ENCOUNTER — Ambulatory Visit: Payer: MEDICAID

## 2017-06-20 ENCOUNTER — Ambulatory Visit: Payer: MEDICAID

## 2023-11-19 ENCOUNTER — Inpatient Hospital Stay (HOSPITAL_COMMUNITY)
Admission: AD | Admit: 2023-11-19 | Discharge: 2023-11-19 | Disposition: A | Discharge status: Home / Self Care | Attending: Obstetrics & Gynecology | Admitting: Obstetrics & Gynecology

## 2023-11-19 ENCOUNTER — Encounter (HOSPITAL_COMMUNITY): Payer: Self-pay | Admitting: *Deleted

## 2023-11-19 ENCOUNTER — Inpatient Hospital Stay (HOSPITAL_COMMUNITY)

## 2023-11-19 ENCOUNTER — Other Ambulatory Visit: Payer: Self-pay

## 2023-11-19 DIAGNOSIS — O3481 Maternal care for other abnormalities of pelvic organs, first trimester: Secondary | ICD-10-CM | POA: Insufficient documentation

## 2023-11-19 DIAGNOSIS — O209 Hemorrhage in early pregnancy, unspecified: Secondary | ICD-10-CM | POA: Diagnosis not present

## 2023-11-19 DIAGNOSIS — Z3A01 Less than 8 weeks gestation of pregnancy: Secondary | ICD-10-CM | POA: Diagnosis not present

## 2023-11-19 DIAGNOSIS — O26851 Spotting complicating pregnancy, first trimester: Secondary | ICD-10-CM | POA: Insufficient documentation

## 2023-11-19 DIAGNOSIS — O200 Threatened abortion: Secondary | ICD-10-CM | POA: Diagnosis not present

## 2023-11-19 DIAGNOSIS — N83202 Unspecified ovarian cyst, left side: Secondary | ICD-10-CM | POA: Diagnosis not present

## 2023-11-19 DIAGNOSIS — O3680X0 Pregnancy with inconclusive fetal viability, not applicable or unspecified: Secondary | ICD-10-CM | POA: Insufficient documentation

## 2023-11-19 LAB — CBC
HCT: 43.8 % (ref 36.0–46.0)
Hemoglobin: 13.4 g/dL (ref 12.0–15.0)
MCH: 27.1 pg (ref 26.0–34.0)
MCHC: 30.6 g/dL (ref 30.0–36.0)
MCV: 88.5 fL (ref 80.0–100.0)
Platelets: 224 10*3/uL (ref 150–400)
RBC: 4.95 MIL/uL (ref 3.87–5.11)
RDW: 14.3 % (ref 11.5–15.5)
WBC: 5.6 10*3/uL (ref 4.0–10.5)
nRBC: 0 % (ref 0.0–0.2)

## 2023-11-19 LAB — URINALYSIS, ROUTINE W REFLEX MICROSCOPIC
Bilirubin Urine: NEGATIVE
Glucose, UA: NEGATIVE mg/dL
Ketones, ur: NEGATIVE mg/dL
Leukocytes,Ua: NEGATIVE
Nitrite: NEGATIVE
Protein, ur: NEGATIVE mg/dL
Specific Gravity, Urine: 1.029 (ref 1.005–1.030)
pH: 5 (ref 5.0–8.0)

## 2023-11-19 LAB — WET PREP, GENITAL
Clue Cells Wet Prep HPF POC: NONE SEEN
Sperm: NONE SEEN
Trich, Wet Prep: NONE SEEN
WBC, Wet Prep HPF POC: >=10 — AB (ref <10)
Yeast Wet Prep HPF POC: NONE SEEN

## 2023-11-19 LAB — ABO/RH: ABO/RH(D): B POS

## 2023-11-19 LAB — POCT PREGNANCY, URINE: Preg Test, Ur: POSITIVE — AB

## 2023-11-19 LAB — HCG, QUANTITATIVE, PREGNANCY: hCG, Beta Chain, Quant, S: 4732 m[IU]/mL — ABNORMAL HIGH (ref <5)

## 2023-11-19 LAB — HIV ANTIBODY (ROUTINE TESTING W REFLEX): HIV Screen 4th Generation wRfx: NONREACTIVE

## 2023-11-19 LAB — RPR: RPR Ser Ql: NONREACTIVE

## 2023-11-19 NOTE — MAU Provider Note (Signed)
 History     CSN: [id number]  Arrival date and time: 11/19/23 9187   Event Date/Time   First Provider Initiated Contact with Patient 11/19/23 1119      Chief Complaint  Patient presents with   Vaginal Bleeding   HCG level   HPI Kelly Chan is a 33 y.o. year old G60P1001 female at [redacted]w[redacted]d weeks gestation who presents to MAU reporting she had heavy vaginal bleeding 2 days ago and was seen at [facility]. She reports she was instructed to follow-up HCG in 2 days, because she could be having a miscarriage. She came here because [facility] is backed up all the time and they don't have a dedicated separate unit like this to take care of pregnant women. She states her vaginal bleeding was like the bottom pads on the pictures in the triage room 2 days ago, but now like the second one with scant blood today. She reports light vaginal bleeding and mild cramping today. She plans to receive [facility] with [facility]; next appt is for an ultrasound on 12/09/2023.    *Review of Care Everywhere Records from 11/18/2023:  HCG = 3,496   OB <14 wks TVUS = 1. Single intrauterine gestational sac without visualization of a fetal pole or yolk sac at this time. The best estimated gestational age is 5 weeks and 2 days +/- 1 week and 4 days. Recommend follow-up ultrasound to assess for viability.  2. Left ovarian minimally complex benign cyst and corpus luteum.   OB History     Gravida  2   Para  1   Term  1   Preterm  0   AB  0   Living  1      SAB  0   IAB  0   Ectopic  0   Multiple  0   Live Births  1           Past Medical History:  Diagnosis Date   Abnormal Pap smear 06/2011   Bartholin cyst    w ward catheter   BV (bacterial vaginosis)    Chlamydia    Gonorrhea    H/O candidiasis    History of bacterial infection    Pregnancy induced hypertension    Yeast infection     Past Surgical History:  Procedure Laterality Date   CESAREAN SECTION  01/17/2012   Procedure: CESAREAN  SECTION;  Surgeon: Nena DELENA App, MD;  Location: [facility];  Service: Obstetrics;  Laterality: N/A;  Priimary Cesarean Section Delivery Boy @0044 , Apgars 9/9   WISDOM TOOTH EXTRACTION      Family History  Problem Relation Age of Onset   Cancer Maternal Aunt        Breast    Cancer Maternal Aunt        Breast   Hypertension Maternal Grandmother    Other Neg Hx     Social History   Tobacco Use   Smoking status: Every Day    Current packs/day: 0.00    Types: Cigarettes    Last attempt to quit: 05/25/2011    Years since quitting: 12.4   Smokeless tobacco: Never   Tobacco comments:    I've quit because I can't tolerate the smell anymore.  Substance Use Topics   Alcohol use: No   Drug use: No    Types: Marijuana    Comment: occ- not regular- quit 04/11/2014    Allergies: No Known Allergies  Medications Prior to Admission  Medication  Sig Dispense Refill Last Dose/Taking   amoxicillin -clavulanate (AUGMENTIN ) 875-125 MG per tablet Take 1 tablet by mouth every 12 (twelve) hours. 14 tablet 0    cephALEXin  (KEFLEX ) 500 MG capsule Take 1 capsule (500 mg total) by mouth 2 (two) times daily. 20 capsule 0    clindamycin  (CLEOCIN ) 2 % vaginal cream Place 1 Applicatorful vaginally at bedtime. X 7 nights (Patient not taking: Reported on 09/06/2014) 40 g 0    fluconazole  (DIFLUCAN ) 150 MG tablet Take 1 tablet (150 mg total) by mouth once. Pick up the refill and and take second dose in 5 days if symptoms have not resolved (Patient not taking: Reported on 09/06/2014) 1 tablet 1    fluconazole  (DIFLUCAN ) 150 MG tablet Take 1 tablet (150 mg total) by mouth once. 1 tablet 0    HYDROcodone -acetaminophen  (NORCO/VICODIN) 5-325 MG per tablet Take 1-2 tablets by mouth every 4 (four) hours as needed. (Patient not taking: Reported on 09/06/2014) 20 tablet 0    ketorolac  (TORADOL ) 10 MG tablet Take 1 tablet (10 mg total) by mouth every 6 (six) hours as needed (Take with food. Do not take more than 4 per day.  Do not take for longer than 5 days). 20 tablet 0    methocarbamol  (ROBAXIN ) 500 MG tablet Take 1 tablet (500 mg total) by mouth 2 (two) times daily. 20 tablet 0    metroNIDAZOLE  (FLAGYL ) 500 MG tablet Take 1 tablet (500 mg total) by mouth 2 (two) times daily. (Patient not taking: Reported on 09/06/2014) 14 tablet 0    ondansetron  (ZOFRAN ) 4 MG tablet Take 1 tablet (4 mg total) by mouth every 6 (six) hours. (Patient not taking: Reported on 09/06/2014) 12 tablet 0     Review of Systems  Constitutional: Negative.   HENT: Negative.    Eyes: Negative.   Respiratory: Negative.    Cardiovascular: Negative.   Gastrointestinal: Negative.   Endocrine: Negative.   Genitourinary:  Positive for pelvic pain (mild cramping) and vaginal bleeding (light).  Musculoskeletal: Negative.   Skin: Negative.   Allergic/Immunologic: Negative.   Neurological: Negative.   Hematological: Negative.   Psychiatric/Behavioral: Negative.     Physical Exam   Blood pressure 123/81, pulse 91, temperature 98.6 F (37 C), temperature source Oral, resp. rate 20, height 5' 7 (1.702 m), weight 102.7 kg, last menstrual period 10/02/2023, SpO2 100%.  Physical Exam Vitals and nursing note reviewed.  Constitutional:      Appearance: Normal appearance. She is obese.  Pulmonary:     Effort: Pulmonary effort is normal.  Genitourinary:    Comments: Swabs collected by patient using blind swab technique   Musculoskeletal:        General: Normal range of motion.   Skin:    General: Skin is warm and dry.   Neurological:     Mental Status: She is alert and oriented to person, place, and time.   Psychiatric:        Mood and Affect: Mood normal.        Behavior: Behavior normal.        Thought Content: Thought content normal.        Judgment: Judgment normal.    MAU Course  Procedures  MDM CCUA UPT CBC ABO/Rh HCG Wet Prep GC/CT -- Results pending  RPR -- Results pending  OB U/S < 14 wks TVUS  Results for  orders placed or performed during the hospital encounter of 11/19/23 (from the past 24 hours)  Pregnancy, urine POC  Status: Abnormal   Collection Time: 11/19/23  8:28 AM  Result Value Ref Range   Preg Test, Ur POSITIVE (A) NEGATIVE  Wet prep, genital     Status: Abnormal   Collection Time: 11/19/23  8:33 AM   Specimen: PATH Cytology Cervicovaginal Ancillary Only  Result Value Ref Range   Yeast Wet Prep HPF POC NONE SEEN NONE SEEN   Trich, Wet Prep NONE SEEN NONE SEEN   Clue Cells Wet Prep HPF POC NONE SEEN NONE SEEN   WBC, Wet Prep HPF POC >=10 (A) <10   Sperm NONE SEEN   ABO/Rh     Status: None   Collection Time: 11/19/23  9:24 AM  Result Value Ref Range   ABO/RH(D) B POS    No rh immune globuloin      NOT A RH IMMUNE GLOBULIN CANDIDATE, PT RH POSITIVE Performed at [facility], [address] [place], [place] [address]   CBC     Status: None   Collection Time: 11/19/23  9:26 AM  Result Value Ref Range   WBC 5.6 4.0 - 10.5 K/uL   RBC 4.95 3.87 - 5.11 MIL/uL   Hemoglobin 13.4 12.0 - 15.0 g/dL   HCT 56.1 63.9 - 53.9 %   MCV 88.5 80.0 - 100.0 fL   MCH 27.1 26.0 - 34.0 pg   MCHC 30.6 30.0 - 36.0 g/dL   RDW 85.6 88.4 - 84.4 %   Platelets 224 150 - 400 K/uL   nRBC 0.0 0.0 - 0.2 %  hCG, quantitative, pregnancy     Status: Abnormal   Collection Time: 11/19/23  9:26 AM  Result Value Ref Range   hCG, Beta Chain, Quant, S 4,732 (H) <5 mIU/mL  Urinalysis, Routine w reflex microscopic -Urine, Clean Catch     Status: Abnormal   Collection Time: 11/19/23  9:45 AM  Result Value Ref Range   Color, Urine YELLOW YELLOW   APPearance CLOUDY (A) CLEAR   Specific Gravity, Urine 1.029 1.005 - 1.030   pH 5.0 5.0 - 8.0   Glucose, UA NEGATIVE NEGATIVE mg/dL   Hgb urine dipstick LARGE (A) NEGATIVE   Bilirubin Urine NEGATIVE NEGATIVE   Ketones, ur NEGATIVE NEGATIVE mg/dL   Protein, ur NEGATIVE NEGATIVE mg/dL   Nitrite NEGATIVE NEGATIVE   Leukocytes,Ua NEGATIVE NEGATIVE   RBC / HPF  0-5 0 - 5 RBC/hpf   WBC, UA 0-5 0 - 5 WBC/hpf   Bacteria, UA FEW (A) NONE SEEN   Squamous Epithelial / HPF 21-50 0 - 5 /HPF   Mucus PRESENT     US  OB LESS THAN 14 WEEKS WITH OB TRANSVAGINAL Result Date: 11/19/2023 CLINICAL DATA:  Vaginal bleeding. LMP: 10/02/2023 corresponding to an estimated gestational age of [redacted] weeks, 6 days. EXAM: OBSTETRIC <14 WK US  AND TRANSVAGINAL OB US  TECHNIQUE: Both transabdominal and transvaginal ultrasound examinations were performed for complete evaluation of the gestation as well as the maternal uterus, adnexal regions, and pelvic cul-de-sac. Transvaginal technique was performed to assess early pregnancy. COMPARISON:  None Available. FINDINGS: Intrauterine gestational sac: Single intrauterine gestational sac. Yolk sac:  Seen Embryo:  Not present Cardiac Activity: N/A MSD: 6 mm   5 w   2 d Subchorionic hemorrhage:  None visualized. Maternal uterus/adnexae: The maternal ovaries are unremarkable. There is a 2.5 cm cyst in the left ovary. IMPRESSION: Single intrauterine gestational sac with an estimated gestational age of [redacted] weeks, 2 days. No fetal pole identified at this time. Follow-up with ultrasound  in 7-11 days, or earlier if clinically indicated, recommended. Electronically Signed   By: Vanetta Chou M.D.   On: 11/19/2023 11:10      Assessment and Plan  1. Vaginal bleeding affecting early pregnancy (Primary) - Information provided on VB in pregnancy - Return to MAU: If you have heavier bleeding that soaks through more that 2 pads per hour for an hour or more If you bleed so much that you feel like you might pass out or you do pass out If you have significant abdominal pain that is not improved with Tylenol  1000 mg every 8 hours as needed for pain If you develop a fever > 100.5    2. Pregnancy with uncertain fetal viability, single or unspecified fetus - Advised that her scheduled U/S on 7/14 is appropriate timing for verifying viability  3. Threatened  miscarriage in early pregnancy - Information provided on threatened miscarriage   4. [redacted] weeks gestation of pregnancy   - Discharge home - Keep scheduled appt with [facility] on 12/09/2023 - Patient verbalized an understanding of the plan of care and agrees.   Kelly Chan, CNM 11/19/2023, 11:28 AM

## 2023-11-19 NOTE — MAU Note (Signed)
 Kelly Chan is a 33 y.o. at Unknown here in MAU reporting: she had heavy VB two days ago and was seen @ [facility], was instructed to follow for HCG level.  States she's here for f/u HCG.  State continues to have light VB and lower abdominal cramping.    Reports had vaginal US  and labs @ [facility] on 11/18/2023.    LMP: 10/02/2023 Onset of complaint: 2 days ago Pain score: 6 Vitals:   11/19/23 0840  BP: 123/81  Pulse: 91  Resp: 20  Temp: 98.6 F (37 C)  SpO2: 100%     FHT: NA  Lab orders placed from triage: UPT & UA   Pt pulled up MyChart account & RN reviewed  HCG -  3496 on 11/18/2023

## 2023-11-19 NOTE — Discharge Instructions (Signed)
 Return to MAU: If you have heavier bleeding that soaks through more that 2 pads per hour for an hour or more If you bleed so much that you feel like you might pass out or you do pass out If you have significant abdominal pain that is not improved with Tylenol 1000 mg every 8 hours as needed for pain If you develop a fever > 100.5

## 2023-11-20 LAB — GC/CHLAMYDIA PROBE AMP (~~LOC~~) NOT AT ARMC
Chlamydia: NEGATIVE
Comment: NEGATIVE
Comment: NORMAL
Neisseria Gonorrhea: NEGATIVE
